# Patient Record
Sex: Female | Born: 1985 | Race: White | Hispanic: No | Marital: Single | Smoking: Never smoker
Health system: Southern US, Community
[De-identification: ages and names within clinical notes are randomized; demographics above are authoritative.]

## PROBLEM LIST (undated history)

## (undated) ENCOUNTER — Inpatient Hospital Stay (HOSPITAL_COMMUNITY): Payer: Self-pay

## (undated) DIAGNOSIS — E039 Hypothyroidism, unspecified: Secondary | ICD-10-CM

## (undated) DIAGNOSIS — F419 Anxiety disorder, unspecified: Secondary | ICD-10-CM

## (undated) DIAGNOSIS — K219 Gastro-esophageal reflux disease without esophagitis: Secondary | ICD-10-CM

## (undated) HISTORY — DX: Anxiety disorder, unspecified: F41.9

## (undated) HISTORY — DX: Gastro-esophageal reflux disease without esophagitis: K21.9

## (undated) HISTORY — PX: WISDOM TOOTH EXTRACTION: SHX21

## (undated) HISTORY — DX: Hypothyroidism, unspecified: E03.9

---

## 2009-08-07 ENCOUNTER — Encounter: Admission: RE | Admit: 2009-08-07 | Discharge: 2009-08-07 | Payer: Self-pay | Admitting: Family Medicine

## 2010-08-07 ENCOUNTER — Encounter: Admission: RE | Admit: 2010-08-07 | Discharge: 2010-08-07 | Payer: Self-pay | Admitting: General Surgery

## 2010-08-20 ENCOUNTER — Ambulatory Visit (HOSPITAL_COMMUNITY): Admission: RE | Admit: 2010-08-20 | Discharge: 2010-08-20 | Payer: Self-pay | Admitting: General Surgery

## 2011-07-22 ENCOUNTER — Other Ambulatory Visit: Payer: Self-pay | Admitting: Gastroenterology

## 2011-07-26 ENCOUNTER — Ambulatory Visit
Admission: RE | Admit: 2011-07-26 | Discharge: 2011-07-26 | Disposition: A | Discharge status: Home / Self Care | Payer: BLUE CROSS/BLUE SHIELD | Source: Ambulatory Visit | Attending: Gastroenterology | Admitting: Gastroenterology

## 2011-10-15 ENCOUNTER — Ambulatory Visit (INDEPENDENT_AMBULATORY_CARE_PROVIDER_SITE_OTHER): Payer: BLUE CROSS/BLUE SHIELD

## 2011-10-15 DIAGNOSIS — J111 Influenza due to unidentified influenza virus with other respiratory manifestations: Secondary | ICD-10-CM

## 2011-10-15 DIAGNOSIS — K130 Diseases of lips: Secondary | ICD-10-CM

## 2011-11-07 ENCOUNTER — Ambulatory Visit (INDEPENDENT_AMBULATORY_CARE_PROVIDER_SITE_OTHER): Payer: BC Managed Care – PPO

## 2011-11-07 DIAGNOSIS — B9789 Other viral agents as the cause of diseases classified elsewhere: Secondary | ICD-10-CM

## 2011-11-07 DIAGNOSIS — J029 Acute pharyngitis, unspecified: Secondary | ICD-10-CM

## 2011-11-07 DIAGNOSIS — R197 Diarrhea, unspecified: Secondary | ICD-10-CM

## 2011-11-08 ENCOUNTER — Ambulatory Visit (INDEPENDENT_AMBULATORY_CARE_PROVIDER_SITE_OTHER): Payer: BC Managed Care – PPO

## 2011-11-08 DIAGNOSIS — R05 Cough: Secondary | ICD-10-CM

## 2011-11-08 DIAGNOSIS — J029 Acute pharyngitis, unspecified: Secondary | ICD-10-CM

## 2011-11-11 ENCOUNTER — Ambulatory Visit (INDEPENDENT_AMBULATORY_CARE_PROVIDER_SITE_OTHER): Payer: BC Managed Care – PPO

## 2011-11-11 DIAGNOSIS — H66019 Acute suppurative otitis media with spontaneous rupture of ear drum, unspecified ear: Secondary | ICD-10-CM

## 2011-11-11 DIAGNOSIS — J40 Bronchitis, not specified as acute or chronic: Secondary | ICD-10-CM

## 2012-03-01 IMAGING — US US ABDOMEN COMPLETE
1 series · 14 of 25 positions shown · non-contrast
Comparison: None.

CLINICAL DATA: Right upper quadrant pain, nausea

ABDOMINAL ULTRASOUND COMPLETE

[Series 1: us abdomen complete · 0.29mm/px · 14 of 89 slices shown]
[im 1/89]
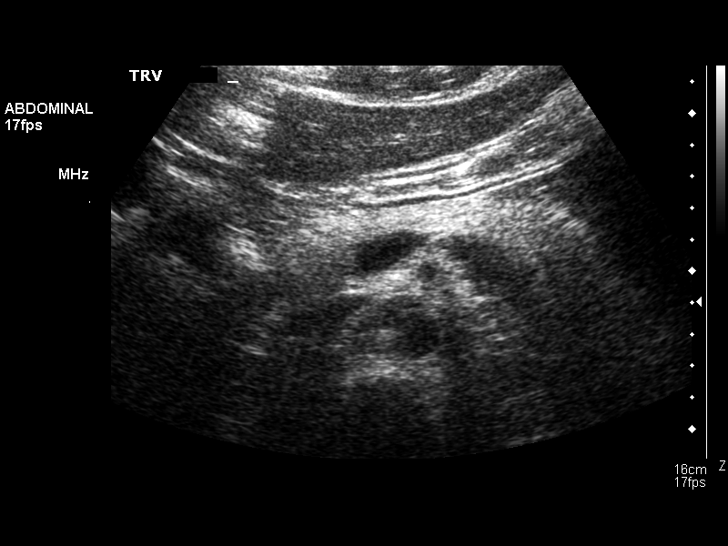
[im 8/89]
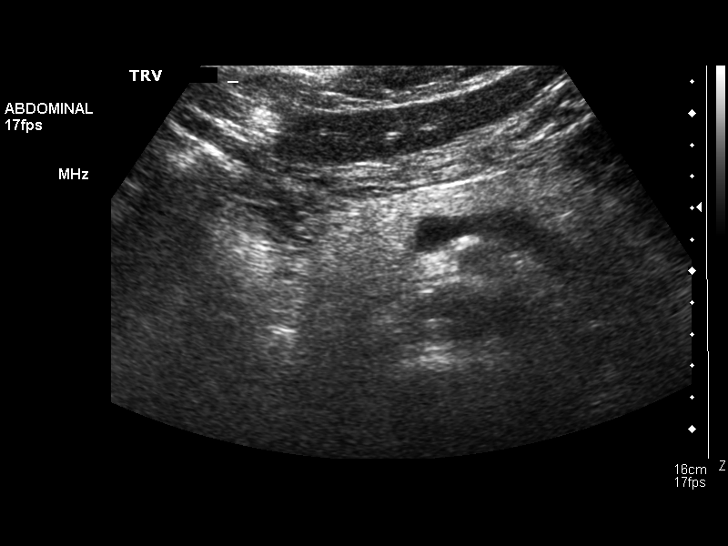
[im 15/89]
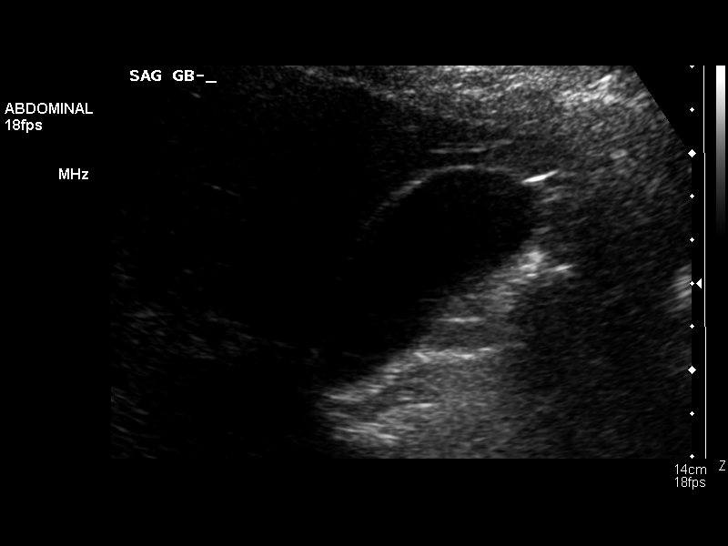
[im 23/89]
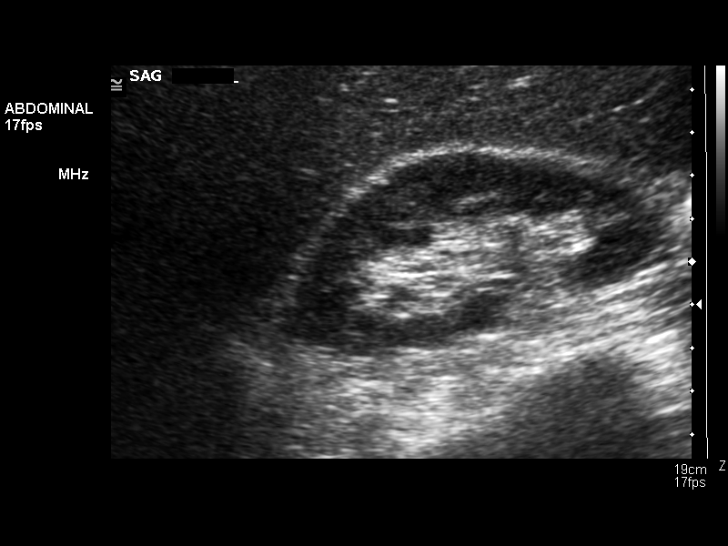
[im 30/89]
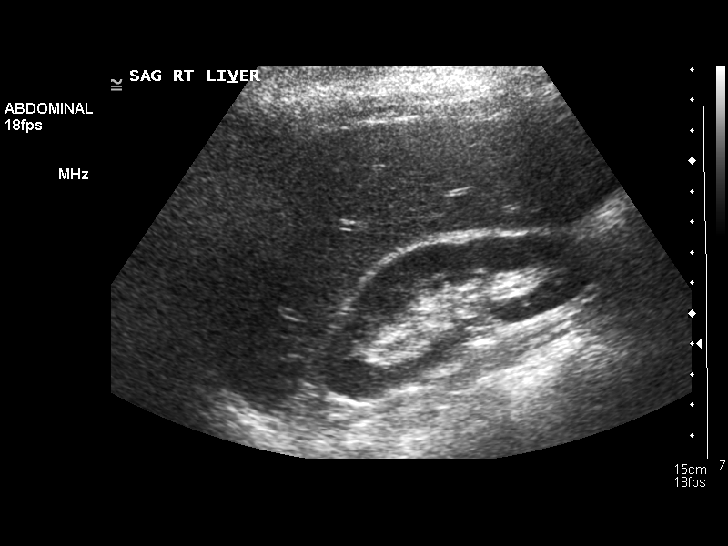
[im 34/89]
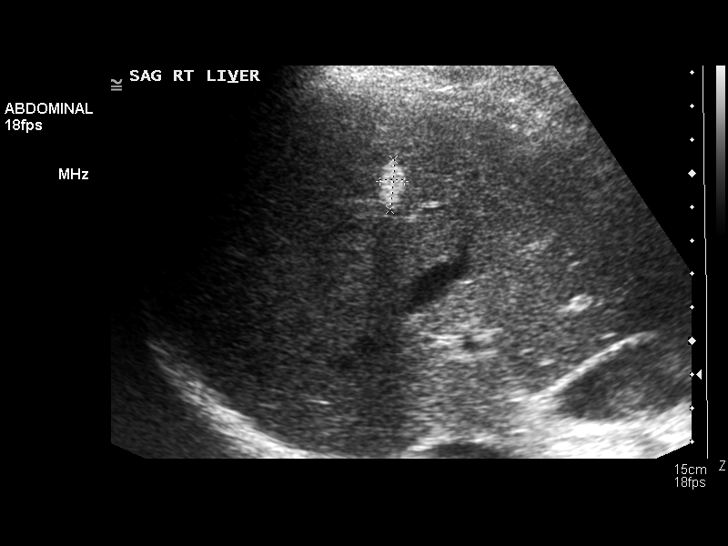
[im 41/89]
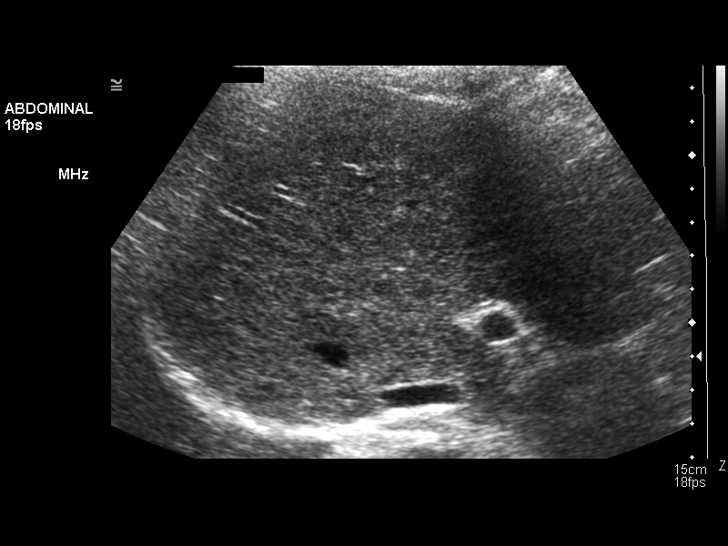
[im 48/89]
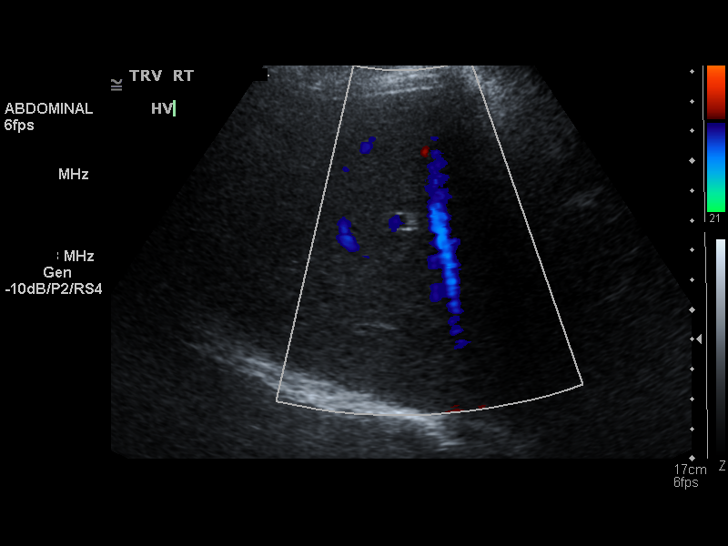
[im 56/89]
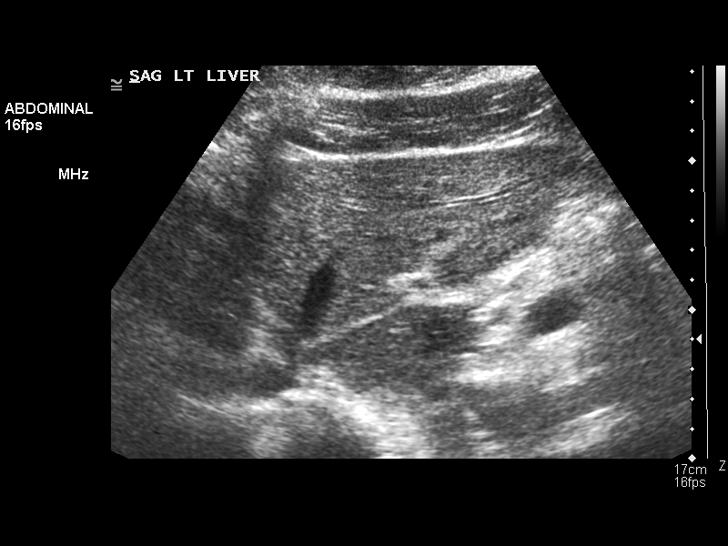
[im 59/89]
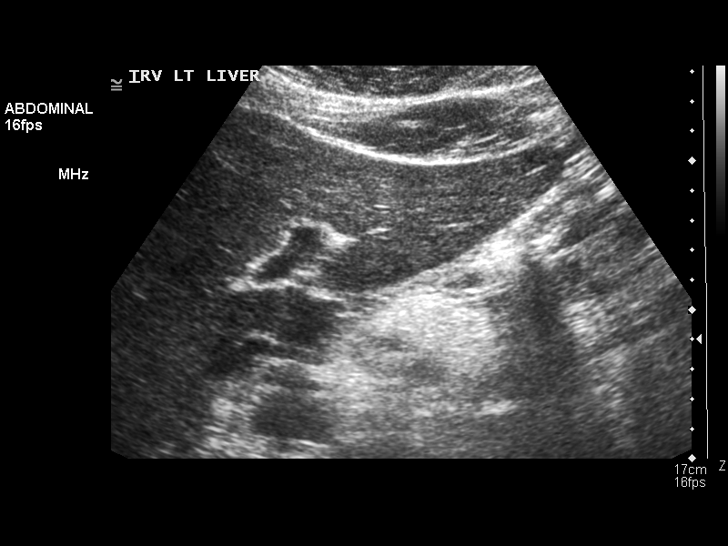
[im 67/89]
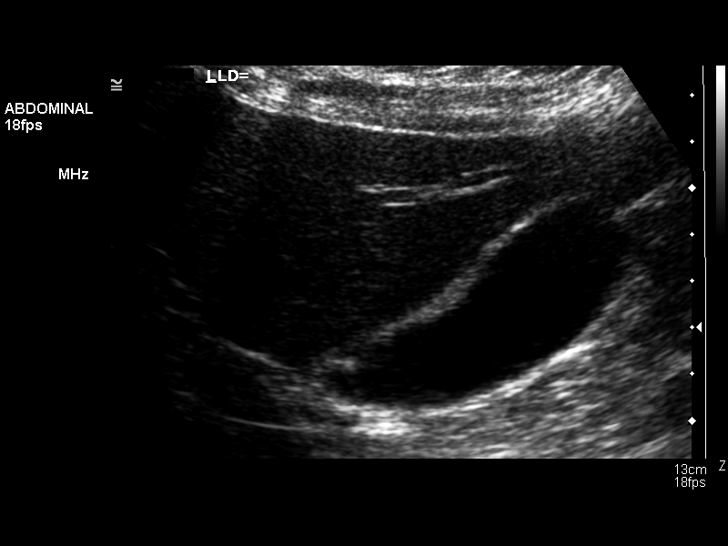
[im 74/89]
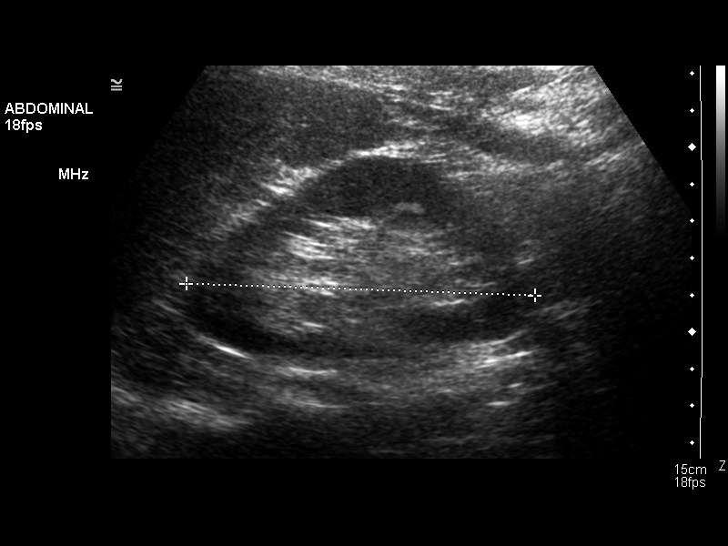
[im 81/89]
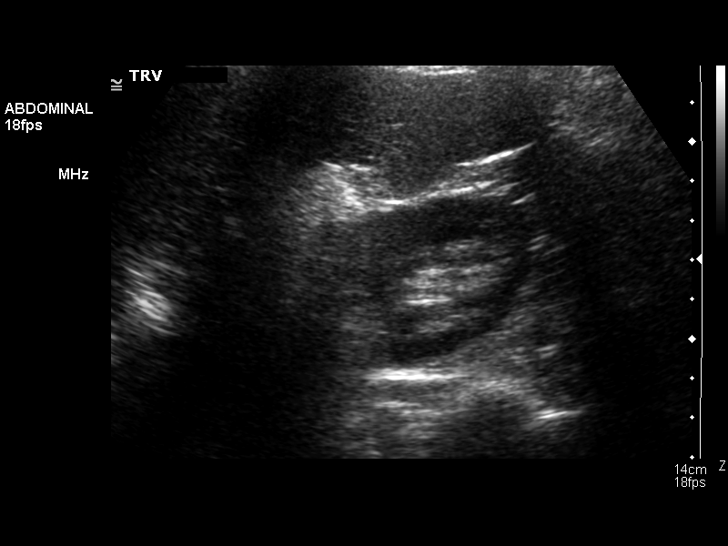
[im 89/89]
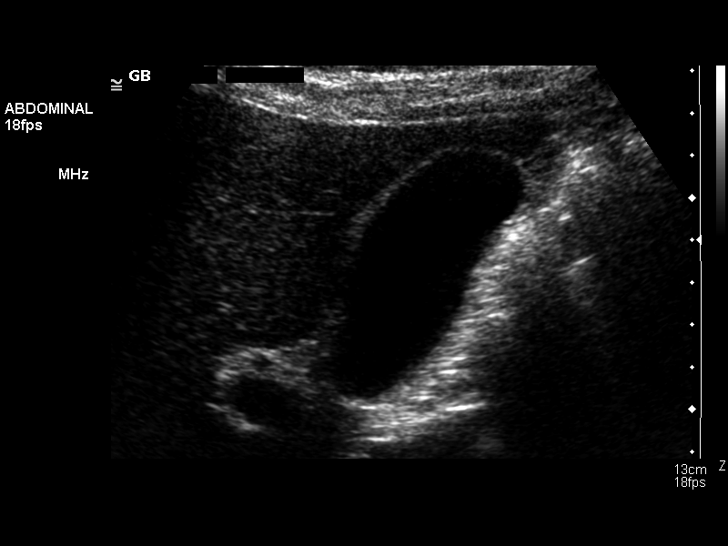

[14 of 25 positions shown; findings below may reference images not displayed]

FINDINGS: Gallbladder:  No gallstones, gallbladder wall thickening, or
pericholecystic fluid.

Common Bile Duct:  Within normal limits in caliber.

Liver: 1.5 cm well-circumscribed oval echogenic lesion within the
right hepatic lobe suspicious for a small hemangioma.  No other
hepatic lesion.  No intrahepatic biliary dilatation.

IVC:  Appears normal.

Pancreas:  No abnormality identified.

Spleen:  Within normal limits in size and echotexture.

Right kidney:  Normal in size and parenchymal echogenicity.  No
evidence of mass or hydronephrosis.

Left kidney:  Normal in size and parenchymal echogenicity.  No
evidence of mass or hydronephrosis.

Abdominal Aorta:  No aneurysm identified.
IMPRESSION: No acute finding by abdominal ultrasound.  Normal gallbladder.
Negative for gallstones.

1.5 cm right hepatic echogenic lesion, most consistent with a small
hemangioma.  Recommend follow-up 6 months to document stability.

## 2012-06-24 ENCOUNTER — Telehealth: Payer: Self-pay

## 2012-06-24 NOTE — Telephone Encounter (Signed)
Pt requests copy of pe and tb test from (she thinks) November of last year. States when called for pick up she might authorize someone else to pick it up.  Best: [phone]  bf

## 2012-06-25 NOTE — Telephone Encounter (Signed)
TB and PE copied for pick up. Left voicemail to notify patient.

## 2012-07-02 ENCOUNTER — Ambulatory Visit (INDEPENDENT_AMBULATORY_CARE_PROVIDER_SITE_OTHER): Payer: BC Managed Care – PPO | Admitting: Physician Assistant

## 2012-07-02 VITALS — BP 116/78 | HR 124 | Temp 98.4°F | Resp 16 | Ht 64.0 in | Wt 291.6 lb

## 2012-07-02 DIAGNOSIS — J029 Acute pharyngitis, unspecified: Secondary | ICD-10-CM

## 2012-07-02 DIAGNOSIS — R000 Tachycardia, unspecified: Secondary | ICD-10-CM

## 2012-07-02 DIAGNOSIS — E039 Hypothyroidism, unspecified: Secondary | ICD-10-CM | POA: Insufficient documentation

## 2012-07-02 LAB — POCT RAPID STREP A (OFFICE): Rapid Strep A Screen: NEGATIVE

## 2012-07-02 MED ORDER — FLUTICASONE PROPIONATE 50 MCG/ACT NA SUSP: 2.0000 | Freq: Every day | NASAL | Status: DC

## 2012-07-02 MED ORDER — IBUPROFEN 800 MG PO TABS: 800.0000 mg | ORAL_TABLET | Freq: Three times a day (TID) | ORAL | Status: AC | PRN

## 2012-07-02 NOTE — Progress Notes (Signed)
  Subjective:    Patient ID: Caroline Howe, female    DOB: 11-26-1985, 26 y.o.   MRN: [id number]  HPI 26 yr old CF presents with ST, nasal congestion, post nasal drip.  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 She denies f/c.  No known exposure to strep.  Review of Systems  All other systems reviewed and are negative.       Objective:   Physical Exam  Nursing note and vitals reviewed. Constitutional: She is oriented to person, place, and time. She appears well-developed and well-nourished.  HENT:  Head: Normocephalic and atraumatic.  Mouth/Throat: Oropharynx is clear and moist. No oropharyngeal exudate (erythema of posterior pharynx ).       R TM bulging slightly and erythematous.  L TM WNL.  +turbinates enlarged and inflamed.  Cardiovascular: Regular rhythm and normal heart sounds.  Exam reveals no gallop and no friction rub.   No murmur heard.      Increased rate  Pulmonary/Chest: Effort normal and breath sounds normal.  Neurological: She is alert and oriented to person, place, and time.  Skin: Skin is warm and dry.   Results for orders placed in visit on 07/02/12  POCT RAPID STREP A (OFFICE)      Component Value Range   Rapid Strep A Screen Negative  Negative      Assessment & Plan:  URI with pharyngitis-likely all viral.  Fluids, rest, and respiratory care. flonase and ibuprofen. Asymptomatic Tachycardia-unsure if illness related.  Patient instructed to keep a watch on this the next couple of weeks and if it persists, she will see her PCP and have her thyroid levels checked/tachycardia workup. Can send in amoxicillin 500mg  2 bid X 10 days if not improving in 3-5 days.

## 2012-07-07 ENCOUNTER — Other Ambulatory Visit: Payer: Self-pay | Admitting: Physician Assistant

## 2012-07-07 MED ORDER — BENZONATATE 100 MG PO CAPS
100.0000 mg | ORAL_CAPSULE | Freq: Three times a day (TID) | ORAL | Status: AC | PRN
Start: 2012-07-07 — End: 2012-07-14

## 2012-07-07 MED ORDER — AMOXICILLIN 875 MG PO TABS: 875.0000 mg | ORAL_TABLET | Freq: Two times a day (BID) | ORAL | Status: AC

## 2012-07-07 MED ORDER — HYDROCOD POLST-CHLORPHEN POLST 10-8 MG/5ML PO LQCR: 5.0000 mL | Freq: Two times a day (BID) | ORAL | Status: DC | PRN

## 2012-07-07 NOTE — Progress Notes (Signed)
Patient requested amoxicilin and a cough medication. Per Angela's note she was to let us know know if she wasn't improving and we could rx an antibiotic.

## 2012-07-25 ENCOUNTER — Ambulatory Visit (INDEPENDENT_AMBULATORY_CARE_PROVIDER_SITE_OTHER): Payer: BC Managed Care – PPO | Admitting: Family Medicine

## 2012-07-25 VITALS — BP 139/95 | HR 92 | Temp 98.0°F | Resp 18 | Ht 64.5 in | Wt 290.8 lb

## 2012-07-25 DIAGNOSIS — N760 Acute vaginitis: Secondary | ICD-10-CM

## 2012-07-25 DIAGNOSIS — N898 Other specified noninflammatory disorders of vagina: Secondary | ICD-10-CM

## 2012-07-25 DIAGNOSIS — L293 Anogenital pruritus, unspecified: Secondary | ICD-10-CM

## 2012-07-25 LAB — POCT WET PREP WITH KOH
KOH Prep POC: NEGATIVE
Trichomonas, UA: NEGATIVE
Yeast Wet Prep HPF POC: NEGATIVE

## 2012-07-25 LAB — GLUCOSE, POCT (MANUAL RESULT ENTRY): POC Glucose: 102 mg/dl — AB (ref 70–99)

## 2012-07-25 MED ORDER — FLUCONAZOLE 150 MG PO TABS: 150.0000 mg | ORAL_TABLET | Freq: Once | ORAL | Status: DC

## 2012-07-25 MED ORDER — METRONIDAZOLE 500 MG PO TABS: ORAL_TABLET | ORAL | Status: DC

## 2012-07-25 NOTE — Progress Notes (Signed)
Subjective: Patient completed a course of antibiotics about 5-7 days ago. For the past couple days she's had vaginal symptoms with itching burning irritation. She used a vaginal suppository last night. She has a history of many episodes of peritonitis in the distant past. She is not known to be diabetic.  Objective: Morbidly obese white female accompanied by her long-term boyfriend. Normal external genitalia. Vaginal and is full of thick cream from a suppository which she used last night. Bimanual exam unremarkable wet prep was taken and swelling of the cream I could.  Assessment: Vaginitis probably from recent antibiotics Morbid obesity Rule out diabetes   Plan  Check glucose and wet prep  Results for orders placed in visit on 07/25/12  POCT WET PREP WITH KOH      Component Value Range   Trichomonas, UA Negative     Clue Cells Wet Prep HPF POC 2-3     Epithelial Wet Prep HPF POC 3-10     Yeast Wet Prep HPF POC NEG     Bacteria Wet Prep HPF POC 2+ COCCI     RBC Wet Prep HPF POC 0-1     WBC Wet Prep HPF POC 2-3     KOH Prep POC Negative    GLUCOSE, POCT (MANUAL RESULT ENTRY)      Component Value Range   POC Glucose 102 (*) 70 - 99 mg/dl   Despite no dx clearly will rx with diflucan and flagyl.  Return when no cream is in vagina if sx persist.

## 2012-07-25 NOTE — Patient Instructions (Addendum)
Use the flagyl for 1 week. No alcohol Use single dose of difulcan

## 2012-08-12 ENCOUNTER — Ambulatory Visit (INDEPENDENT_AMBULATORY_CARE_PROVIDER_SITE_OTHER): Payer: BC Managed Care – PPO | Admitting: Family Medicine

## 2012-08-12 VITALS — BP 126/99 | HR 101 | Temp 97.9°F | Resp 22 | Ht 64.0 in | Wt 289.8 lb

## 2012-08-12 DIAGNOSIS — K146 Glossodynia: Secondary | ICD-10-CM

## 2012-08-12 MED ORDER — DIPHENHYD-HYDROCORT-NYSTATIN MT SUSP: 5.0000 mL | Freq: Three times a day (TID) | OROMUCOSAL | Status: DC

## 2012-08-12 NOTE — Progress Notes (Signed)
Subjective: Patient is a 26 year old female coming in with a complaint of a yellow tongue. Patient states that she's had this approximately for 3 days. Patient states that then she attempt to pressure, which did get yellow off but caused bleeding. Patient is concerned that she has a thrush infection. Patient denies any recent medication changes or any recent antibiotic use. Patient also denies any sick contacts herself had a recent viral illness. Denies any fevers or chills denies any abnormal weight loss.  Review of systems as above  Past medical surgical family and social history reviewed without any changes  Physical exam Blood pressure 126/99, pulse 101, temperature 97.9 F (36.6 C), temperature source Oral, resp. rate 22, height 5\' 4"  (1.626 m), weight 289 lb 12.8 oz (131.452 kg), last menstrual period 06/28/2012, SpO2 100.00%. General: No apparent distress alert and oriented x3 severely obese female HEENT: Pupils equal round reactive to light and accommodation conjunctiva normal, moist mucous membranes with mild phlegm accumulation on tongue. No true signs of infection. No signs of bleeding. Posterior pharynx does have a postnasal drip. Pulmonary: Clear to auscultation bilaterally Cardiovascular: Regular in rhythm no murmur Abdomen: Bowel sounds positive nontender nondistended  Assessment and plan One tongue bleeding that has resolved at this time. I likely think this is secondary to her postnasal drip and aggressive time cleaning. Patient though is very adamant that she feels that she may have an infection. Told her I would send her in some nystatin Zosyn follow if this would make her feel better the likelihood of this medication causing any side effects is minimal. Patient is much appreciated. Patient return in 10-14 days if no improvement.

## 2012-12-11 ENCOUNTER — Ambulatory Visit (INDEPENDENT_AMBULATORY_CARE_PROVIDER_SITE_OTHER): Payer: BC Managed Care – PPO | Admitting: Emergency Medicine

## 2012-12-11 VITALS — BP 136/91 | HR 101 | Temp 99.0°F | Resp 18 | Ht 64.2 in | Wt 275.8 lb

## 2012-12-11 DIAGNOSIS — B852 Pediculosis, unspecified: Secondary | ICD-10-CM

## 2012-12-11 MED ORDER — PERMETHRIN 0.5 % AERO: INHALATION_SPRAY | Freq: Once | Status: DC

## 2012-12-11 MED ORDER — SULFAMETHOXAZOLE-TRIMETHOPRIM 800-160 MG PO TABS: 1.0000 | ORAL_TABLET | Freq: Two times a day (BID) | ORAL | Status: DC

## 2012-12-11 NOTE — Patient Instructions (Addendum)
Pyrethrins; Piperonyl Butoxide Shampoo What is this medicine? PYRETHINS; PIPERONYL BUTOXIDE (pi RETH rins; PI per on il byoo TOX ide) is used to treat lice. It acts by destroying the lice, but it does not destroy their eggs (nits). This medicine may be used to treat head lice, body lice, or pubic lice. This medicine may be used for other purposes; ask your health care provider or pharmacist if you have questions. What should I tell my health care provider before I take this medicine? They need to know if you have any of these conditions: -asthma -an unusual or allergic reaction to pyrethrins, piperonyl butoxide, other medicines, foods, dyes, ragweed, or preservatives -pregnant or trying to get pregnant -breast-feeding How should I use this medicine? This medicine is for external use only. Do not take by mouth. Follow the directions on the package label. Use this medicine in a well ventilated area. Shake well before use. Apply to dry hair. Keep this medicine away from your eyes, mouth, nose, and vaginal opening. Apply to affected area until all the hair is thoroughly wet with product. Keep this medicine on your hair or affected area for 10 minutes. After 10 minutes, add warm water then rub into a lather. Rinse and dry with a clean towel. Use a nit comb to remove any of the remaining lice or nits. A second treatment must be done in 7 to 10 days to kill any newly hatched lice. Talk to your pediatrician regarding the use of this medicine in children. While this drug may be prescribed for children as young as 21 years old for selected conditions, precautions do apply. Overdosage: If you think you have taken too much of this medicine contact a poison control center or emergency room at once. NOTE: This medicine is only for you. Do not share this medicine with others. What if I miss a dose? If you miss a dose, use it as soon as you can. If it is almost time for your next dose, use only that dose. Do not use  double or extra doses. What may interact with this medicine? Interactions are not expected. This list may not describe all possible interactions. Give your health care provider a list of all the medicines, herbs, non-prescription drugs, or dietary supplements you use. Also tell them if you smoke, drink alcohol, or use illegal drugs. Some items may interact with your medicine. What should I watch for while using this medicine? Lice infections can cause itching and irritation. This medicine may cause more itching for a short time after use. This does not mean that the medicine did not work. Lice can be spread from one person to another by direct contact with clothing, hats, scarves, bedding, towels, washcloths, hairbrushes, and combs. All members of the house should be checked for lice. Treat everyone who is infected. For cases of pubic lice, sexual partners should be treated at the same time to prevent reinfection. To prevent reinfection or spreading of the infection, the following steps should be taken: Machine wash all clothing, bedding, towels, and washcloths in very hot water and dry them using the hot cycle of a dryer for at least 20 minutes. Clothing or bedding that cannot be washed should be dry cleaned or sealed in an airtight plastic bag for 4 weeks. Shampoo any wigs or hairpieces. You should also wash all hairbrushes and combs in very hot soapy water (above 130 F) for 5 to 10 minutes. Do not share your hairbrushes or combs with other people.  Wash all toys in very hot water (above 130 F) for 5 to 10 minutes or seal in an airtight plastic bag for 4 weeks. Also, clean the house or room by vacuuming furniture, rugs, and floors. What side effects may I notice from receiving this medicine? Side effects that you should report to your doctor or health care professional as soon as possible: -allergic reactions like skin rash, itching or hives, swelling of the face, lips, or tongue -breathing  problems -skin burning, irritation Side effects that usually do not require medical attention (report to your doctor or health care professional if they continue or are bothersome): -dry, itchy, red skin -tingling sensation This list may not describe all possible side effects. Call your doctor for medical advice about side effects. You may report side effects to FDA at 1-800-FDA-1088. Where should I keep my medicine? Keep out of the reach of children. Store at room temperature between 15 and 30 degrees C (59 and 86 degrees F). Throw away any unused medicine after the expiration date. NOTE: This sheet is a summary. It may not cover all possible information. If you have questions about this medicine, talk to your doctor, pharmacist, or health care provider.  2013, [employer] Standard. (05/20/2008 2:07:53 PM) Head and Pubic Lice Lice are tiny, light brown insects with claws on the ends of their legs. They are small parasites that live on the human body. Lice often make their home in your hair. They hatch from little round eggs (nits), which are attached to the base of hairs. They spread by:  Direct contact with an infested person.  Infested personal items such as combs, brushes, towels, clothing, pillow cases and sheets. The parasite that causes your condition may also live in clothes which have been worn within the week before treatment. Therefore, it is necessary to wash your clothes, bed linens, towels, combs and brushes. Any woolens can be put in an air-tight plastic bag for one week. You need to use fresh clothes, towels and sheets after your treatment is completed. Re-treatment is usually not necessary if instructions are followed. If necessary, treatment may be repeated in 7 days. The entire family may require treatment. Sexual partners should be treated if the nits are present in the pubic area. TREATMENT  Apply enough medicated shampoo or cream to wet hair and skin in and around the  infected areas.  Work thoroughly into hair and leave in according to instructions.  Add a small amount of water until a good lather forms.  Rinse thoroughly.  Towel briskly.  When hair is dry, any remaining nits, cream or shampoo may be removed with a fine-tooth comb or tweezers. The nits resemble dandruff; however they are glued to the hair follicle and are difficult to brush out. Frequent fine combing and shampoos are necessary. A towel soaked in white vinegar and left on the hair for 2 hours will also help soften the glue which holds the nits on the hair. Medicated shampoo or cream should not be used on children or pregnant women without a caregiver's prescription or instructions. SEEK MEDICAL CARE IF:   You or your child develops sores that look infected.  The rash does not go away in one week.  The lice or nits return or persist in spite of treatment. Document Released: 10/14/2005 Document Revised: 01/06/2012 Document Reviewed: 05/13/2007 [facility] Patient Information 2013 [place], [place].

## 2012-12-11 NOTE — Progress Notes (Signed)
[facility] [address] [place] [address] [phone]  Date:  12/11/2012   Name:  Caroline Howe   DOB:  September 02, 1986   MRN:  [id number]  PCP:  Tally Due, MD    Chief Complaint: Head Lice   History of Present Illness:  Yajaira Doffing is a 27 y.o. very pleasant female patient who presents with the following:  Found lice on her hair today.  Moderate itching.  Teaches school.    Patient Active Problem List  Diagnosis  . Unspecified hypothyroidism    Past Medical History  Diagnosis Date  . Anxiety   . GERD (gastroesophageal reflux disease)   . Hypothyroidism     History reviewed. No pertinent past surgical history.  History  Substance Use Topics  . Smoking status: Never Smoker   . Smokeless tobacco: Never Used  . Alcohol Use: Yes     Comment: occasional    Family History  Problem Relation Age of Onset  . Diabetes Mother   . Hypertension Father   . Liver cancer Maternal Grandfather   . Colon cancer Paternal Grandfather     No Known Allergies  Medication list has been reviewed and updated.  Current Outpatient Prescriptions on File Prior to Visit  Medication Sig Dispense Refill  . esomeprazole (NEXIUM) 40 MG capsule Take 40 mg by mouth daily before breakfast.      . levothyroxine (SYNTHROID, LEVOTHROID) 75 MCG tablet Take 75 mcg by mouth daily.      . chlorpheniramine-HYDROcodone (TUSSIONEX PENNKINETIC ER) 10-8 MG/5ML LQCR Take 5 mLs by mouth every 12 (twelve) hours as needed (cough).  60 mL  0  . Diphenhyd-Hydrocort-Nystatin SUSP Use as directed 5 mLs in the mouth or throat 3 (three) times daily.  100 mL  0  . fluconazole (DIFLUCAN) 150 MG tablet Take 1 tablet (150 mg total) by mouth once.  1 tablet  0  . fluticasone (FLONASE) 50 MCG/ACT nasal spray Place 2 sprays into the nose daily.  16 g  6  . metroNIDAZOLE (FLAGYL) 500 MG tablet Take one twice daily for bv  14 tablet  0   No current facility-administered medications on file  prior to visit.    Review of Systems:  As per HPI, otherwise negative.    Physical Examination: Filed Vitals:   12/11/12 1601  BP: 136/91  Pulse: 101  Temp: 99 F (37.2 C)  Resp: 18   Filed Vitals:   12/11/12 1601  Height: 5' 4.2" (1.631 m)  Weight: 275 lb 12.8 oz (125.102 kg)   Body mass index is 47.03 kg/(m^2). Ideal Body Weight: Weight in (lb) to have BMI = 25: 146.3   GEN: WDWN, NAD, Non-toxic, Alert & Oriented x 3 HEENT: Atraumatic, Normocephalic.  Ears and Nose: No external deformity. EXTR: No clubbing/cyanosis/edema NEURO: Normal gait.  PSYCH: Normally interactive. Conversant. Not depressed or anxious appearing.  Calm demeanor.  SCALP:  Nits on hair shafts   Assessment and Plan: Lice RID Septra ds (per sanford)  Carmelina Dane, MD

## 2013-03-01 ENCOUNTER — Ambulatory Visit (INDEPENDENT_AMBULATORY_CARE_PROVIDER_SITE_OTHER): Payer: BC Managed Care – PPO | Admitting: Family Medicine

## 2013-03-01 VITALS — BP 108/66 | HR 111 | Temp 98.7°F | Resp 16 | Ht 64.0 in | Wt 289.0 lb

## 2013-03-01 DIAGNOSIS — H6593 Unspecified nonsuppurative otitis media, bilateral: Secondary | ICD-10-CM

## 2013-03-01 DIAGNOSIS — H6590 Unspecified nonsuppurative otitis media, unspecified ear: Secondary | ICD-10-CM

## 2013-03-01 MED ORDER — MOMETASONE FUROATE 50 MCG/ACT NA SUSP: 2.0000 | Freq: Every day | NASAL | Status: DC

## 2013-03-01 NOTE — Patient Instructions (Addendum)
Serous Otitis Media    Serous otitis media is also known as otitis media with effusion (OME). It means there is fluid in the middle ear space. This space contains the bones for hearing and air. Air in the middle ear space helps to transmit sound.    The air gets there through the eustachian tube. This tube goes from the back of the throat to the middle ear space. It keeps the pressure in the middle ear the same as the outside world. It also helps to drain fluid from the middle ear space.  CAUSES    OME occurs when the eustachian tube gets blocked. Blockage can come from:   Ear infections.   Colds and other upper respiratory infections.   Allergies.   Irritants such as cigarette smoke.   Sudden changes in air pressure (such as descending in an airplane).   Enlarged adenoids.  During colds and upper respiratory infections, the middle ear space can become temporarily filled with fluid. This can happen after an ear infection also. Once the infection clears, the fluid will generally drain out of the ear through the eustachian tube. If it does not, then OME occurs.  SYMPTOMS     Hearing loss.   A feeling of fullness in the ear  but no pain.   Young children may not show any symptoms.  DIAGNOSIS     Diagnosis of OME is made by an ear exam.   Tests may be done to check on the movement of the eardrum.   Hearing exams may be done.  TREATMENT     The fluid most often goes away without treatment.   If allergy is the cause, allergy treatment may be helpful.   Fluid that persists for several months may require minor surgery. A small tube is placed in the ear drum to:   Drain the fluid.   Restore the air in the middle ear space.   In certain situations, antibiotics are used to avoid surgery.   Surgery may be done to remove enlarged adenoids (if this is the cause).  HOME CARE INSTRUCTIONS     Keep children away from tobacco smoke.   Be sure to keep follow up appointments, if any.  SEEK MEDICAL CARE IF:      Hearing is not better in 3 months.   Hearing is worse.   Ear pain.   Drainage from the ear.   Dizziness.  Document Released: 01/04/2004 Document Revised: 01/06/2012 Document Reviewed: 11/03/2008  [employer] Patient Information 2013 [employer].

## 2013-03-01 NOTE — Progress Notes (Signed)
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  Some facial pressure as well.  No fever, loss of hearing, vertigo  Obj:  NAD Tm's opaque, no erythema, loss of light reflex but malleoli clear Oroph:  Clear Nose:  M/p discharge Neck:  Supple, no adenopathy  Assessment:  SOM  Plan:  nasonex  Signed, Elvina Sidle, MD

## 2013-03-14 ENCOUNTER — Ambulatory Visit (INDEPENDENT_AMBULATORY_CARE_PROVIDER_SITE_OTHER): Payer: BC Managed Care – PPO | Admitting: Physician Assistant

## 2013-03-14 VITALS — BP 143/75 | HR 100 | Temp 98.8°F | Resp 18 | Ht 65.0 in | Wt 283.0 lb

## 2013-03-14 DIAGNOSIS — R059 Cough, unspecified: Secondary | ICD-10-CM

## 2013-03-14 DIAGNOSIS — R05 Cough: Secondary | ICD-10-CM

## 2013-03-14 DIAGNOSIS — J40 Bronchitis, not specified as acute or chronic: Secondary | ICD-10-CM

## 2013-03-14 MED ORDER — AZITHROMYCIN 250 MG PO TABS: ORAL_TABLET | ORAL | Status: DC

## 2013-03-14 MED ORDER — BENZONATATE 100 MG PO CAPS: 100.0000 mg | ORAL_CAPSULE | Freq: Three times a day (TID) | ORAL | Status: DC | PRN

## 2013-03-14 NOTE — Progress Notes (Signed)
Patient ID: Caroline Howe MRN: [id number], DOB: [birth year], 27 y.o. Date of Encounter: 03/14/2013, 11:50 AM  Primary Physician: Tally Due, MD  Chief Complaint:  Chief Complaint  Patient presents with  . Follow-up    cough    HPI: 27 y.o. female presents with a 15 day history of nasal congestion, post nasal drip, and cough. Mild sinus pressure. Afebrile. No chills. Nasal congestion thick and green/yellow. Cough is productive of green/yellow sputum and not associated with time of day. No SOB or wheezing. Was seen on 03/01/13 and prescribed Nasonex which has helped considerably with her ears, however now the drainage has increased down her throat causing an increase in her cough. No GI complaints. Appetite normal. Works as a Engineer, site. No recent antibiotics, or recent travels.   No leg trauma, sedentary periods, h/o cancer, or tobacco use.  Past Medical History  Diagnosis Date  . Anxiety   . GERD (gastroesophageal reflux disease)   . Hypothyroidism      Home Meds: Prior to Admission medications   Medication Sig Start Date End Date Taking? Authorizing Provider  esomeprazole (NEXIUM) 40 MG capsule Take 40 mg by mouth daily before breakfast.   Yes Historical Provider, MD  levothyroxine (SYNTHROID, LEVOTHROID) 75 MCG tablet Take 75 mcg by mouth daily.   Yes Historical Provider, MD  mometasone (NASONEX) 50 MCG/ACT nasal spray Place 2 sprays into the nose daily. 03/01/13  Yes Elvina Sidle, MD  azithromycin (ZITHROMAX Z-PAK) 250 MG tablet 2 tabs po first day, then 1 tab po next 4 days 03/14/13   Raymon Mutton Shaila Gilchrest, PA-C  benzonatate (TESSALON PERLES) 100 MG capsule Take 1 capsule (100 mg total) by mouth 3 (three) times daily as needed for cough. 03/14/13   Raymon Mutton Shakevia [name], PA-C  fluticasone (FLONASE) 50 MCG/ACT nasal spray Place 2 sprays into the nose daily. 07/02/12 07/02/13  Anders Simmonds, PA-C  pyrethrins-piperonyl butoxide 0.5 % bottle Apply topically once. 12/11/12   Phillips Odor, MD     Allergies: No Known Allergies  History   Social History  . Marital Status: Single    Spouse Name: N/A    Number of Children: N/A  . Years of Education: N/A   Occupational History  . Not on file.   Social History Main Topics  . Smoking status: Never Smoker   . Smokeless tobacco: Never Used  . Alcohol Use: Yes     Comment: occasional  . Drug Use: No  . Sexually Active: Yes    Birth Control/ Protection: Condom   Other Topics Concern  . Not on file   Social History Narrative  . No narrative on file     Review of Systems: Constitutional: negative for chills, fever, night sweats or weight changes HEENT: see above Cardiovascular: negative for chest pain or palpitations Respiratory: positive for cough. negative for hemoptysis, wheezing, or shortness of breath Abdominal: negative for abdominal pain, nausea, vomiting or diarrhea Dermatological: negative for rash Neurologic: negative for headache   Physical Exam: Blood pressure 143/75, pulse 100, temperature 98.8 F (37.1 C), temperature source Oral, resp. rate 18, height 5\' 5"  (1.651 m), weight 283 lb (128.368 kg), last menstrual period 02/22/2013, SpO2 98.00%., Body mass index is 47.09 kg/(m^2). General: Well developed, well nourished, in no acute distress. Head: Normocephalic, atraumatic, eyes without discharge, sclera non-icteric, nares are congested. Bilateral auditory canals clear, TM's are without perforation, pearly grey with reflective cone of light bilaterally. No sinus TTP. Oral cavity moist, dentition normal. Posterior  pharynx with post nasal drip and mild erythema. No peritonsillar abscess or tonsillar exudate. Uvula midline.  Neck: Supple. No thyromegaly. Full ROM. No lymphadenopathy. Lungs: Coarse breath sounds bilaterally without wheezes, rales, or rhonchi. Breathing is unlabored.  Heart: RRR with S1 S2. No murmurs, rubs, or gallops appreciated. Msk:  Strength and tone normal for age. Extremities: No  clubbing or cyanosis. No edema. Neuro: Alert and oriented X 3. Moves all extremities spontaneously. CNII-XII grossly in tact. Psych:  Responds to questions appropriately with a normal affect.     ASSESSMENT AND PLAN:  27 y.o. female with bronchitis and cough -Azithromycin 250 MG #6 2 po first day then 1 po next 4 days no RF -Tessalon Perles 100 mg 1 po tid prn cough #40 no RF  -Mucinex -Continue Nasonex -Tylenol/Motrin prn -Rest/fluids -RTC precautions -RTC 3-5 days if no improvement  Signed, Eula Listen, PA-C 03/14/2013 11:50 AM

## 2013-04-14 ENCOUNTER — Ambulatory Visit (INDEPENDENT_AMBULATORY_CARE_PROVIDER_SITE_OTHER): Payer: BC Managed Care – PPO | Admitting: Family Medicine

## 2013-04-14 VITALS — BP 132/92 | HR 100 | Temp 98.0°F | Resp 16 | Ht 64.5 in | Wt 282.0 lb

## 2013-04-14 DIAGNOSIS — B9689 Other specified bacterial agents as the cause of diseases classified elsewhere: Secondary | ICD-10-CM

## 2013-04-14 DIAGNOSIS — N760 Acute vaginitis: Secondary | ICD-10-CM

## 2013-04-14 DIAGNOSIS — L293 Anogenital pruritus, unspecified: Secondary | ICD-10-CM

## 2013-04-14 DIAGNOSIS — N898 Other specified noninflammatory disorders of vagina: Secondary | ICD-10-CM

## 2013-04-14 DIAGNOSIS — B379 Candidiasis, unspecified: Secondary | ICD-10-CM

## 2013-04-14 LAB — POCT WET PREP WITH KOH
KOH Prep POC: POSITIVE
Trichomonas, UA: NEGATIVE
WBC Wet Prep HPF POC: NEGATIVE
Yeast Wet Prep HPF POC: NEGATIVE

## 2013-04-14 LAB — POCT URINALYSIS DIPSTICK
Bilirubin, UA: NEGATIVE
Blood, UA: NEGATIVE
Glucose, UA: NEGATIVE
Ketones, UA: NEGATIVE
Leukocytes, UA: NEGATIVE
Nitrite, UA: NEGATIVE
Protein, UA: NEGATIVE
Spec Grav, UA: 1.02
Urobilinogen, UA: 0.2
pH, UA: 7

## 2013-04-14 LAB — POCT UA - MICROSCOPIC ONLY
Casts, Ur, LPF, POC: NEGATIVE
Crystals, Ur, HPF, POC: NEGATIVE
Mucus, UA: POSITIVE
Yeast, UA: NEGATIVE

## 2013-04-14 MED ORDER — FLUCONAZOLE 150 MG PO TABS: 150.0000 mg | ORAL_TABLET | Freq: Once | ORAL | Status: DC

## 2013-04-14 MED ORDER — METRONIDAZOLE 500 MG PO TABS: 500.0000 mg | ORAL_TABLET | Freq: Two times a day (BID) | ORAL | Status: DC

## 2013-04-14 NOTE — Patient Instructions (Addendum)
Candida Infection, Adult A candida infection (also called yeast, fungus and Monilia infection) is an overgrowth of yeast that can occur anywhere on the body. A yeast infection commonly occurs in warm, moist body areas. Usually, the infection remains localized but can spread to become a systemic infection. A yeast infection may be a sign of a more severe disease such as diabetes, leukemia, or AIDS. A yeast infection can occur in both men and women. In women, Candida vaginitis is a vaginal infection. It is one of the most common causes of vaginitis. Men usually do not have symptoms or know they have an infection until other problems develop. Men may find out they have a yeast infection because their sex partner has a yeast infection. Uncircumcised men are more likely to get a yeast infection than circumcised men. This is because the uncircumcised glans is not exposed to air and does not remain as dry as that of a circumcised glans. Older adults may develop yeast infections around dentures. CAUSES  Women  Antibiotics.  Steroid medication taken for a long time.  Being overweight (obese).  Diabetes.  Poor immune condition.  Certain serious medical conditions.  Immune suppressive medications for organ transplant patients.  Chemotherapy.  Pregnancy.  Menstration.  Stress and fatigue.  Intravenous drug use.  Oral contraceptives.  Wearing tight-fitting clothes in the crotch area.  Catching it from a sex partner who has a yeast infection.  Spermicide.  Intravenous, urinary, or other catheters. Men  Catching it from a sex partner who has a yeast infection.  Having oral or anal sex with a person who has the infection.  Spermicide.  Diabetes.  Antibiotics.  Poor immune system.  Medications that suppress the immune system.  Intravenous drug use.  Intravenous, urinary, or other catheters. SYMPTOMS  Women  Thick, white vaginal discharge.  Vaginal itching.  Redness and  swelling in and around the vagina.  Irritation of the lips of the vagina and perineum.  Blisters on the vaginal lips and perineum.  Painful sexual intercourse.  Low blood sugar (hypoglycemia).  Painful urination.  Bladder infections.  Intestinal problems such as constipation, indigestion, bad breath, bloating, increase in gas, diarrhea, or loose stools. Men  Men may develop intestinal problems such as constipation, indigestion, bad breath, bloating, increase in gas, diarrhea, or loose stools.  Dry, cracked skin on the penis with itching or discomfort.  Jock itch.  Dry, flaky skin.  Athlete's foot.  Hypoglycemia. DIAGNOSIS  Women  A history and an exam are performed.  The discharge may be examined under a microscope.  A culture may be taken of the discharge. Men  A history and an exam are performed.  Any discharge from the penis or areas of cracked skin will be looked at under the microscope and cultured.  Stool samples may be cultured. TREATMENT  Women  Vaginal antifungal suppositories and creams.  Medicated creams to decrease irritation and itching on the outside of the vagina.  Warm compresses to the perineal area to decrease swelling and discomfort.  Oral antifungal medications.  Medicated vaginal suppositories or cream for repeated or recurrent infections.  Wash and dry the irritation areas before applying the cream.  Eating yogurt with lactobacillus may help with prevention and treatment.  Sometimes painting the vagina with gentian violet solution may help if creams and suppositories do not work. Men  Antifungal creams and oral antifungal medications.  Sometimes treatment must continue for 30 days after the symptoms go away to prevent recurrence. HOME CARE   INSTRUCTIONS  Women  Use cotton underwear and avoid tight-fitting clothing.  Avoid colored, scented toilet paper and deodorant tampons or pads.  Do not douche.  Keep your diabetes  under control.  Finish all the prescribed medications.  Keep your skin clean and dry.  Consume milk or yogurt with lactobacillus active culture regularly. If you get frequent yeast infections and think that is what the infection is, there are over-the-counter medications that you can get. If the infection does not show healing in 3 days, talk to your caregiver.  Tell your sex partner you have a yeast infection. Your partner may need treatment also, especially if your infection does not clear up or recurs. Men  Keep your skin clean and dry.  Keep your diabetes under control.  Finish all prescribed medications.  Tell your sex partner that you have a yeast infection so they can be treated if necessary. SEEK MEDICAL CARE IF:   Your symptoms do not clear up or worsen in one week after treatment.  You have an oral temperature above 102 F (38.9 C).  You have trouble swallowing or eating for a prolonged time.  You develop blisters on and around your vagina.  You develop vaginal bleeding and it is not your menstrual period.  You develop abdominal pain.  You develop intestinal problems as mentioned above.  You get weak or lightheaded.  You have painful or increased urination.  You have pain during sexual intercourse. MAKE SURE YOU:   Understand these instructions.  Will watch your condition.  Will get help right away if you are not doing well or get worse. Document Released: 11/21/2004 Document Revised: 01/06/2012 Document Reviewed: 03/05/2010 [facility] Patient Information 2014 [place], [place]. Bacterial Vaginosis Bacterial vaginosis (BV) is a vaginal infection where the normal balance of bacteria in the vagina is disrupted. The normal balance is then replaced by an overgrowth of certain bacteria. There are several different kinds of bacteria that can cause BV. BV is the most common vaginal infection in women of childbearing age. CAUSES   The cause of BV is not fully  understood. BV develops when there is an increase or imbalance of harmful bacteria.  Some activities or behaviors can upset the normal balance of bacteria in the vagina and put women at increased risk including:  Having a new sex partner or multiple sex partners.  Douching.  Using an intrauterine device (IUD) for contraception.  It is not clear what role sexual activity plays in the development of BV. However, women that have never had sexual intercourse are rarely infected with BV. Women do not get BV from toilet seats, bedding, swimming pools or from touching objects around them.  SYMPTOMS   Grey vaginal discharge.  A fish-like odor with discharge, especially after sexual intercourse.  Itching or burning of the vagina and vulva.  Burning or pain with urination.  Some women have no signs or symptoms at all. DIAGNOSIS  Your caregiver must examine the vagina for signs of BV. Your caregiver will perform lab tests and look at the sample of vaginal fluid through a microscope. They will look for bacteria and abnormal cells (clue cells), a pH test higher than 4.5, and a positive amine test all associated with BV.  RISKS AND COMPLICATIONS   Pelvic inflammatory disease (PID).  Infections following gynecology surgery.  Developing HIV.  Developing herpes virus. TREATMENT  Sometimes BV will clear up without treatment. However, all women with symptoms of BV should be treated to avoid complications, especially  if gynecology surgery is planned. Female partners generally do not need to be treated. However, BV may spread between female sex partners so treatment is helpful in preventing a recurrence of BV.   BV may be treated with antibiotics. The antibiotics come in either pill or vaginal cream forms. Either can be used with nonpregnant or pregnant women, but the recommended dosages differ. These antibiotics are not harmful to the baby.  BV can recur after treatment. If this happens, a second  round of antibiotics will often be prescribed.  Treatment is important for pregnant women. If not treated, BV can cause a premature delivery, especially for a pregnant woman who had a premature birth in the past. All pregnant women who have symptoms of BV should be checked and treated.  For chronic reoccurrence of BV, treatment with a type of prescribed gel vaginally twice a week is helpful. HOME CARE INSTRUCTIONS   Finish all medication as directed by your caregiver.  Do not have sex until treatment is completed.  Tell your sexual partner that you have a vaginal infection. They should see their caregiver and be treated if they have problems, such as a mild rash or itching.  Practice safe sex. Use condoms. Only have 1 sex partner. PREVENTION  Basic prevention steps can help reduce the risk of upsetting the natural balance of bacteria in the vagina and developing BV:  Do not have sexual intercourse (be abstinent).  Do not douche.  Use all of the medicine prescribed for treatment of BV, even if the signs and symptoms go away.  Tell your sex partner if you have BV. That way, they can be treated, if needed, to prevent reoccurrence. SEEK MEDICAL CARE IF:   Your symptoms are not improving after 3 days of treatment.  You have increased discharge, pain, or fever. MAKE SURE YOU:   Understand these instructions.  Will watch your condition.  Will get help right away if you are not doing well or get worse. FOR MORE INFORMATION  Division of STD Prevention (DSTDP), Centers for Disease Control and Prevention: [employer] American Social Health Association (ASHA): www.ashastd.org  Document Released: 10/14/2005 Document Revised: 01/06/2012 Document Reviewed: 04/06/2009 [facility] Patient Information 2014 [place], [place].

## 2013-04-14 NOTE — Progress Notes (Signed)
 [facility]:  Office Visit  Chief Complaint:  Chief Complaint  Patient presents with  . Vaginitis    x 1 mth    HPI: Caroline Howe is a 27 y.o. female who complains of  Peri-vaginal itching, just itching, no redness, no pain with urination , no fevers, chills, nausea, pelvic pain.  Used to get yeaast infections all the time, 4x a year. Then she started using refresh and got better, last pap was 1 1/2 years ago . No h/o abnormal paps.  No dysuria, no fevers, no chills. No vaginal dc. + abx use in 03/14/2013. She is here with her fiance.   Past Medical History  Diagnosis Date  . Anxiety   . GERD (gastroesophageal reflux disease)   . Hypothyroidism    History reviewed. No pertinent past surgical history. History   Social History  . Marital Status: Single    Spouse Name: N/A    Number of Children: N/A  . Years of Education: N/A   Social History Main Topics  . Smoking status: Never Smoker   . Smokeless tobacco: Never Used  . Alcohol Use: Yes     Comment: occasional  . Drug Use: No  . Sexually Active: Yes    Birth Control/ Protection: Condom   Other Topics Concern  . None   Social History Narrative  . None   Family History  Problem Relation Age of Onset  . Diabetes Mother   . Hypertension Father   . Liver cancer Maternal Grandfather   . Colon cancer Paternal Grandfather    No Known Allergies Prior to Admission medications   Medication Sig Start Date End Date Taking? Authorizing Provider  esomeprazole (NEXIUM) 40 MG capsule Take 40 mg by mouth daily before breakfast.   Yes Historical Provider, MD  fluticasone (FLONASE) 50 MCG/ACT nasal spray Place 2 sprays into the nose daily. 07/02/12 07/02/13 Yes Anders Simmonds, PA-C  levothyroxine (SYNTHROID, LEVOTHROID) 75 MCG tablet Take 75 mcg by mouth daily.   Yes Historical Provider, MD  azithromycin (ZITHROMAX Z-PAK) 250 MG tablet 2 tabs po first day, then 1 tab po next 4 days 03/14/13   Raymon Mutton Dunn, PA-C   benzonatate (TESSALON PERLES) 100 MG capsule Take 1 capsule (100 mg total) by mouth 3 (three) times daily as needed for cough. 03/14/13   Ryan M Dunn, PA-C  mometasone (NASONEX) 50 MCG/ACT nasal spray Place 2 sprays into the nose daily. 03/01/13   Elvina Sidle, MD  pyrethrins-piperonyl butoxide 0.5 % bottle Apply topically once. 12/11/12   Phillips Odor, MD     ROS: The patient denies fevers, chills, night sweats, unintentional weight loss, chest pain, palpitations, wheezing, dyspnea on exertion, nausea, vomiting, abdominal pain, dysuria, hematuria, melena, numbness, weakness, or tingling.   All other systems have been reviewed and were otherwise negative with the exception of those mentioned in the HPI and as above.    PHYSICAL EXAM: Filed Vitals:   04/14/13 1412  BP: 132/92  Pulse: 100  Temp: 98 F (36.7 C)  Resp: 16   Filed Vitals:   04/14/13 1412  Height: 5' 4.5" (1.638 m)  Weight: 282 lb (127.914 kg)   Body mass index is 47.67 kg/(m^2).  General: Alert, no acute distress. Morbidly obese white  female HEENT:  Normocephalic, atraumatic, oropharynx patent.  Cardiovascular:  Regular rate and rhythm, no rubs murmurs or gallops.  No Carotid bruits, radial pulse intact. No pedal edema.  Respiratory: Clear to auscultation bilaterally.  No  wheezes, rales, or rhonchi.  No cyanosis, no use of accessory musculature GI: No organomegaly, abdomen is soft and non-tender, positive bowel sounds.  No masses. Skin: No rashes. Neurologic: Facial musculature symmetric. Psychiatric: Patient is appropriate throughout our interaction. Lymphatic: No cervical lymphadenopathy Musculoskeletal: Gait intact. Perivaginal erythema, no masses, lesions. No CMT, + chuncky white DC   LABS: Results for orders placed in visit on 04/14/13  POCT WET PREP WITH KOH      Result Value Range   Trichomonas, UA Negative     Clue Cells Wet Prep HPF POC 2-4     Epithelial Wet Prep HPF POC 5-8     Yeast Wet Prep  HPF POC negative     Bacteria Wet Prep HPF POC 2+     RBC Wet Prep HPF POC 0-1     WBC Wet Prep HPF POC negative     KOH Prep POC Positive    POCT UA - MICROSCOPIC ONLY      Result Value Range   WBC, Ur, HPF, POC 0-1     RBC, urine, microscopic 1-3     Bacteria, U Microscopic 1+     Mucus, UA positive     Epithelial cells, urine per micros 0-3     Crystals, Ur, HPF, POC negative     Casts, Ur, LPF, POC negative     Yeast, UA negative    POCT URINALYSIS DIPSTICK      Result Value Range   Color, UA yellow     Clarity, UA clear     Glucose, UA neg     Bilirubin, UA neg     Ketones, UA neg     Spec Grav, UA 1.020     Blood, UA neg     pH, UA 7.0     Protein, UA neg     Urobilinogen, UA 0.2     Nitrite, UA neg     Leukocytes, UA Negative       EKG/XRAY:   Primary read interpreted by Dr. Conley Rolls at [facility].   ASSESSMENT/PLAN: Encounter Diagnoses  Name Primary?  . Vaginal itching Yes  . Vaginal discharge   . Bacterial vaginosis   . Candidiasis     Rx Flagyl Rx Diflucan F/u prn   ,  PHUONG, DO 04/14/2013 3:11 PM

## 2013-10-04 ENCOUNTER — Ambulatory Visit (INDEPENDENT_AMBULATORY_CARE_PROVIDER_SITE_OTHER): Payer: BC Managed Care – PPO | Admitting: Family Medicine

## 2013-10-04 VITALS — BP 122/82 | HR 109 | Temp 98.6°F | Resp 16 | Ht 64.0 in | Wt 285.0 lb

## 2013-10-04 DIAGNOSIS — R112 Nausea with vomiting, unspecified: Secondary | ICD-10-CM

## 2013-10-04 DIAGNOSIS — E039 Hypothyroidism, unspecified: Secondary | ICD-10-CM

## 2013-10-04 DIAGNOSIS — K219 Gastro-esophageal reflux disease without esophagitis: Secondary | ICD-10-CM

## 2013-10-04 LAB — POCT INFLUENZA A/B
Influenza A, POC: NEGATIVE
Influenza B, POC: NEGATIVE

## 2013-10-04 MED ORDER — PROMETHAZINE HCL 25 MG/ML IJ SOLN: 25.0000 mg | Freq: Four times a day (QID) | INTRAMUSCULAR | Status: DC | PRN

## 2013-10-04 MED ORDER — PROMETHAZINE HCL 25 MG/ML IJ SOLN
25.0000 mg | Freq: Once | INTRAMUSCULAR | Status: AC
  Administered 2013-10-04: 25 mg via INTRAMUSCULAR

## 2013-10-04 MED ORDER — PROMETHAZINE HCL 12.5 MG PO TABS: 12.5000 mg | ORAL_TABLET | Freq: Three times a day (TID) | ORAL | Status: DC | PRN

## 2013-10-04 NOTE — Progress Notes (Signed)
27 yo woman with 12 hours of nausea, 4 hours of vomiting.    Patient works as Midwife.  Married.  LMP: current (started three days ago).  PMHx:  GERD controlled by omeprazole             Hypothyroidism controlled by levothyroxine.  ROS:  Positive for headache, no dizziness, head trauma, loss of consciousness.  Objective: No acute distress HEENT: Unremarkable Chest: Clear Heart: Regular Abdomen:  Decreased bowel sounds.  Nontender.  No HSM.  Results for orders placed in visit on 10/04/13  POCT INFLUENZA A/B      Result Value Range   Influenza A, POC Negative     Influenza B, POC Negative     Assessment:  Viral gastroenteritis  Plan: Hypothyroidism  GERD (gastroesophageal reflux disease) - Plan: DISCONTINUED: promethazine (PHENERGAN) injection 25 mg  Nausea with vomiting - Plan: POCT Influenza A/B, promethazine (PHENERGAN) injection 25 mg, promethazine (PHENERGAN) 12.5 MG tablet, DISCONTINUED: promethazine (PHENERGAN) injection 25 mg  Signed, Elvina Sidle, MD

## 2013-10-04 NOTE — Patient Instructions (Signed)

## 2013-10-04 NOTE — Progress Notes (Signed)
A 27 year old woman with 12 hours of nausea and 4 hours of vomiting

## 2013-10-10 ENCOUNTER — Ambulatory Visit (INDEPENDENT_AMBULATORY_CARE_PROVIDER_SITE_OTHER): Payer: BC Managed Care – PPO | Admitting: Family Medicine

## 2013-10-10 VITALS — BP 118/72 | HR 101 | Temp 98.0°F | Resp 17 | Ht 65.0 in | Wt 280.0 lb

## 2013-10-10 DIAGNOSIS — R197 Diarrhea, unspecified: Secondary | ICD-10-CM

## 2013-10-10 DIAGNOSIS — R141 Gas pain: Secondary | ICD-10-CM

## 2013-10-10 DIAGNOSIS — R140 Abdominal distension (gaseous): Secondary | ICD-10-CM

## 2013-10-10 NOTE — Progress Notes (Signed)
Subjective:  This chart was scribed for Caroline Staggers, MD by Leone Payor, ED Scribe. This patient was seen in room 2 and the patient's care was started 10:18 AM.   Patient ID: Caroline Howe, female    DOB: [birth year], 27 y.o.   MRN: [id number]  HPI  HPI Comments: Caroline Howe is a 27 y.o. female with past medical history of GERD who presents to [facility] complaining of constant, unchanged abdominal bloating and diarrhea since yesterday. Pt states she had nausea and vomiting about 1 week ago and was prescribed Phenergan. Pt states those symptoms have resolved but is now having about 12-13 episodes of diarrhea and foul-smelling belching since yesterday. She has has taken Imodium and Gas X without relief. Pt states she has been able to tolerate liquids. 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 She is able to tolerate liquids and is urinating normally. She denies hematochezia or back pain. No vomiting. No fever.    Patient Active Problem List   Diagnosis Date Noted   Hypothyroidism 10/04/2013   GERD (gastroesophageal reflux disease) 10/04/2013   Past Medical History  Diagnosis Date   Anxiety    GERD (gastroesophageal reflux disease)    Hypothyroidism    No past surgical history on file. No Known Allergies Prior to Admission medications   Medication Sig Start Date End Date Taking? Authorizing Provider  fluticasone (FLONASE) 50 MCG/ACT nasal spray Place 2 sprays into the nose daily. 07/02/12 10/10/13 Yes Anders Simmonds, PA-C  levothyroxine (SYNTHROID, LEVOTHROID) 75 MCG tablet Take 75 mcg by mouth daily.   Yes Historical Provider, MD  mometasone (NASONEX) 50 MCG/ACT nasal spray Place 2 sprays into the nose daily. 03/01/13  Yes Elvina Sidle, MD  omeprazole (PRILOSEC) 40 MG capsule Take 40 mg by mouth daily.   Yes Historical Provider, MD  promethazine (PHENERGAN) 12.5 MG tablet Take 1 tablet (12.5 mg total) by mouth every 8  (eight) hours as needed for nausea or vomiting. 10/04/13   Elvina Sidle, MD   History   Social History   Marital Status: Single    Spouse Name: N/A    Number of Children: N/A   Years of Education: N/A   Occupational History   Not on file.   Social History Main Topics   Smoking status: Never Smoker    Smokeless tobacco: Never Used   Alcohol Use: Yes     Comment: occasional   Drug Use: No   Sexual Activity: Yes    Birth Control/ Protection: Condom   Other Topics Concern   Not on file   Social History Narrative   No narrative on file     Review of Systems  Gastrointestinal: Positive for diarrhea and abdominal distention (feels bloating without actual swelling). Negative for nausea, vomiting and blood in stool.  Musculoskeletal: Negative for back pain.       Objective:   Physical Exam  Nursing note and vitals reviewed. Constitutional: She is oriented to person, place, and time. She appears well-developed and well-nourished. No distress.  HENT:  Head: Normocephalic and atraumatic.  Mouth/Throat: Oropharynx is clear and moist and mucous membranes are normal. No posterior oropharyngeal erythema.  Eyes: EOM are normal. Pupils are equal, round, and reactive to light.  Cardiovascular: Regular rhythm and normal heart sounds.  Tachycardia present.   No murmur heard. Slightly elevated heart rate around 100.   Pulmonary/Chest: Effort normal and breath sounds normal. No respiratory distress. She has  no wheezes. She has no rales.  Abdominal: Soft. She exhibits no distension. There is no tenderness. There is no rebound, no guarding and no CVA tenderness.  Hyperactive bowel sounds. No focal tenderness.  Musculoskeletal: She exhibits no edema.  Extremities have no edema. Back is non tender. No CVA tenderness.   Lymphadenopathy:    She has no cervical adenopathy.  Neurological: She is alert and oriented to person, place, and time.  Skin: Skin is warm and dry. She is not  diaphoretic.  Psychiatric: She has a normal mood and affect. Her behavior is normal.     Filed Vitals:   10/10/13 0901  BP: 118/72  Pulse: 101  Temp: 98 F (36.7 C)  TempSrc: Oral  Resp: 17  Height: 5\' 5"  (1.651 m)  Weight: 280 lb (127.007 kg)  SpO2: 97%        Assessment & Plan:   Caroline Howe is a 26 y.o. female Diarrhea - Plan: Stool culture  Bloating - with gas  Suspected viral GE as initial n/v last week, then diarrhea yesterday, with sick contacts. abd nt/nd - reassuring exam, and no vomiting, doubt obstruction. XR and lab work discussed, but deferred at present for trial of ORT and sx care. Rapid onset of diarrhea - anal leakage, but no urinary incontinence, no low back pain/weakness or saddle anesthesia. RTC/ER precautions discussed.   No orders of the defined types were placed in this encounter.   Patient Instructions  Your diarrhea is likely from the infection you had last week. Drink plenty of fluids - water is best.  Fluids are more important than solids, but when you eat solid foods- bland foods (avoid fried foods, spicy foods, fast foods).  Immodium if needed over the counter. If any abdominal pain, fever, vomiting, or worsening of your symptoms - return here or in emergency room. If your symptoms are not improving in the next 2-3 days.   You should receive a call or letter about your lab results within the next week to 10 days.    Diarrhea Diarrhea is frequent loose and watery bowel movements. It can cause you to feel weak and dehydrated. Dehydration can cause you to become tired and thirsty, have a dry mouth, and have decreased urination that often is dark yellow. Diarrhea is a sign of another problem, most often an infection that will not last long. In most cases, diarrhea typically lasts 2 3 days. However, it can last longer if it is a sign of something more serious. It is important to treat your diarrhea as directed by your caregive to lessen or prevent future  episodes of diarrhea. CAUSES  Some common causes include:  Gastrointestinal infections caused by viruses, bacteria, or parasites.  Food poisoning or food allergies.  Certain medicines, such as antibiotics, chemotherapy, and laxatives.  Artificial sweeteners and fructose.  Digestive disorders. HOME CARE INSTRUCTIONS  Ensure adequate fluid intake (hydration): have 1 cup (8 oz) of fluid for each diarrhea episode. Avoid fluids that contain simple sugars or sports drinks, fruit juices, whole milk products, and sodas. Your urine should be clear or pale yellow if you are drinking enough fluids. Hydrate with an oral rehydration solution that you can purchase at pharmacies, retail stores, and online. You can prepare an oral rehydration solution at home by mixing the following ingredients together:    tsp table salt.   tsp baking soda.   tsp salt substitute containing potassium chloride.  1  tablespoons sugar.  1 L (34 oz)  of water.  Certain foods and beverages may increase the speed at which food moves through the gastrointestinal (GI) tract. These foods and beverages should be avoided and include:  Caffeinated and alcoholic beverages.  High-fiber foods, such as raw fruits and vegetables, nuts, seeds, and whole grain breads and cereals.  Foods and beverages sweetened with sugar alcohols, such as xylitol, sorbitol, and mannitol.  Some foods may be well tolerated and may help thicken stool including:  Starchy foods, such as rice, toast, pasta, low-sugar cereal, oatmeal, grits, baked potatoes, crackers, and bagels.  Bananas.  Applesauce.  Add probiotic-rich foods to help increase healthy bacteria in the GI tract, such as yogurt and fermented milk products.  Wash your hands well after each diarrhea episode.  Only take over-the-counter or prescription medicines as directed by your caregiver.  Take a warm bath to relieve any burning or pain from frequent diarrhea episodes. SEEK  IMMEDIATE MEDICAL CARE IF:   You are unable to keep fluids down.  You have persistent vomiting.  You have blood in your stool, or your stools are black and tarry.  You do not urinate in 6 8 hours, or there is only a small amount of very dark urine.  You have abdominal pain that increases or localizes.  You have weakness, dizziness, confusion, or lightheadedness.  You have a severe headache.  Your diarrhea gets worse or does not get better.  You have a fever or persistent symptoms for more than 2 3 days.  You have a fever and your symptoms suddenly get worse. MAKE SURE YOU:   Understand these instructions.  Will watch your condition.  Will get help right away if you are not doing well or get worse. Document Released: 10/04/2002 Document Revised: 09/30/2012 Document Reviewed: 06/21/2012 [facility] Patient Information 2014 [place], [place].     I personally performed the services described in this documentation, which was scribed in my presence. The recorded information has been reviewed and considered, and addended by me as needed.

## 2013-10-10 NOTE — Patient Instructions (Addendum)
Your diarrhea is likely from the infection you had last week. Drink plenty of fluids - water is best.  Fluids are more important than solids, but when you eat solid foods- bland foods (avoid fried foods, spicy foods, fast foods).  Immodium if needed over the counter. If any abdominal pain, fever, vomiting, or worsening of your symptoms - return here or in emergency room. If your symptoms are not improving in the next 2-3 days.   You should receive a call or letter about your lab results within the next week to 10 days.    Diarrhea Diarrhea is frequent loose and watery bowel movements. It can cause you to feel weak and dehydrated. Dehydration can cause you to become tired and thirsty, have a dry mouth, and have decreased urination that often is dark yellow. Diarrhea is a sign of another problem, most often an infection that will not last long. In most cases, diarrhea typically lasts 2 3 days. However, it can last longer if it is a sign of something more serious. It is important to treat your diarrhea as directed by your caregive to lessen or prevent future episodes of diarrhea. CAUSES  Some common causes include:  Gastrointestinal infections caused by viruses, bacteria, or parasites.  Food poisoning or food allergies.  Certain medicines, such as antibiotics, chemotherapy, and laxatives.  Artificial sweeteners and fructose.  Digestive disorders. HOME CARE INSTRUCTIONS  Ensure adequate fluid intake (hydration): have 1 cup (8 oz) of fluid for each diarrhea episode. Avoid fluids that contain simple sugars or sports drinks, fruit juices, whole milk products, and sodas. Your urine should be clear or pale yellow if you are drinking enough fluids. Hydrate with an oral rehydration solution that you can purchase at pharmacies, retail stores, and online. You can prepare an oral rehydration solution at home by mixing the following ingredients together:    tsp table salt.   tsp baking soda.   tsp salt  substitute containing potassium chloride.  1  tablespoons sugar.  1 L (34 oz) of water.  Certain foods and beverages may increase the speed at which food moves through the gastrointestinal (GI) tract. These foods and beverages should be avoided and include:  Caffeinated and alcoholic beverages.  High-fiber foods, such as raw fruits and vegetables, nuts, seeds, and whole grain breads and cereals.  Foods and beverages sweetened with sugar alcohols, such as xylitol, sorbitol, and mannitol.  Some foods may be well tolerated and may help thicken stool including:  Starchy foods, such as rice, toast, pasta, low-sugar cereal, oatmeal, grits, baked potatoes, crackers, and bagels.  Bananas.  Applesauce.  Add probiotic-rich foods to help increase healthy bacteria in the GI tract, such as yogurt and fermented milk products.  Wash your hands well after each diarrhea episode.  Only take over-the-counter or prescription medicines as directed by your caregiver.  Take a warm bath to relieve any burning or pain from frequent diarrhea episodes. SEEK IMMEDIATE MEDICAL CARE IF:   You are unable to keep fluids down.  You have persistent vomiting.  You have blood in your stool, or your stools are black and tarry.  You do not urinate in 6 8 hours, or there is only a small amount of very dark urine.  You have abdominal pain that increases or localizes.  You have weakness, dizziness, confusion, or lightheadedness.  You have a severe headache.  Your diarrhea gets worse or does not get better.  You have a fever or persistent symptoms for more than  2 3 days.  You have a fever and your symptoms suddenly get worse. MAKE SURE YOU:   Understand these instructions.  Will watch your condition.  Will get help right away if you are not doing well or get worse. Document Released: 10/04/2002 Document Revised: 09/30/2012 Document Reviewed: 06/21/2012 [facility] Patient Information 2014 [place],  [place].

## 2013-10-14 LAB — STOOL CULTURE

## 2014-01-19 ENCOUNTER — Ambulatory Visit (INDEPENDENT_AMBULATORY_CARE_PROVIDER_SITE_OTHER): Payer: BC Managed Care – PPO | Admitting: Family Medicine

## 2014-01-19 VITALS — BP 140/98 | HR 95 | Temp 98.3°F | Resp 20 | Ht 64.25 in | Wt 289.2 lb

## 2014-01-19 DIAGNOSIS — J019 Acute sinusitis, unspecified: Secondary | ICD-10-CM

## 2014-01-19 DIAGNOSIS — R059 Cough, unspecified: Secondary | ICD-10-CM

## 2014-01-19 DIAGNOSIS — R05 Cough: Secondary | ICD-10-CM

## 2014-01-19 MED ORDER — FLUCONAZOLE 150 MG PO TABS: 150.0000 mg | ORAL_TABLET | Freq: Once | ORAL | Status: DC

## 2014-01-19 MED ORDER — AZITHROMYCIN 250 MG PO TABS: ORAL_TABLET | ORAL | Status: DC

## 2014-01-19 MED ORDER — BENZONATATE 100 MG PO CAPS: 200.0000 mg | ORAL_CAPSULE | Freq: Two times a day (BID) | ORAL | Status: DC | PRN

## 2014-01-19 NOTE — Patient Instructions (Signed)

## 2014-01-19 NOTE — Progress Notes (Signed)
Chief Complaint:  Chief Complaint  Patient presents with  . Sinusitis    x 5 days    HPI: Caroline Howe is a 28 y.o. female who is here for a 5 day history of sinus sxs, facial pain, ear pain, cough, and temp of 100 Tmax was this AM. Has taken robitusssion and also Ibuprofen ( last dose was 11 am) without relief. She has minimal sore throat. She is a Engineer, site. She feels congestion in her face and head. Denies CP, SOB   Past Medical History  Diagnosis Date  . Anxiety   . GERD (gastroesophageal reflux disease)   . Hypothyroidism    History reviewed. No pertinent past surgical history. History   Social History  . Marital Status: Single    Spouse Name: N/A    Number of Children: N/A  . Years of Education: N/A   Social History Main Topics  . Smoking status: Never Smoker   . Smokeless tobacco: Never Used  . Alcohol Use: Yes     Comment: occasional  . Drug Use: No  . Sexual Activity: Yes    Birth Control/ Protection: Condom   Other Topics Concern  . None   Social History Narrative  . None   Family History  Problem Relation Age of Onset  . Diabetes Mother   . Hypertension Father   . Liver cancer Maternal Grandfather   . Colon cancer Paternal Grandfather    No Known Allergies Prior to Admission medications   Medication Sig Start Date End Date Taking? Authorizing Provider  levothyroxine (SYNTHROID, LEVOTHROID) 75 MCG tablet Take 75 mcg by mouth daily.   Yes Historical Provider, MD  omeprazole (PRILOSEC) 40 MG capsule Take 40 mg by mouth daily.   Yes Historical Provider, MD     ROS: The patient deniesnight sweats, unintentional weight loss, chest pain, palpitations, wheezing, dyspnea on exertion, nausea, vomiting, abdominal pain, dysuria, hematuria, melena, numbness, weakness, or tingling.   All other systems have been reviewed and were otherwise negative with the exception of those mentioned in the HPI and as above.    PHYSICAL EXAM: Filed Vitals:   01/19/14 1650  BP: 140/98  Pulse: 95  Temp: 98.3 F (36.8 C)  Resp: 20   Filed Vitals:   01/19/14 1650  Height: 5' 4.25" (1.632 m)  Weight: 289 lb 3.2 oz (131.18 kg)   Body mass index is 49.25 kg/(m^2).  General: Alert, no acute distress, obese female HEENT:  Normocephalic, atraumatic, oropharynx patent. EOMI, PERRLA.  No exudates. + sinus tendernss, boggy nares Cardiovascular:  Regular rate and rhythm, no rubs murmurs or gallops.  No Carotid bruits, radial pulse intact. No pedal edema.  Respiratory: Clear to auscultation bilaterally.  No wheezes, rales, or rhonchi.  No cyanosis, no use of accessory musculature GI: No organomegaly, abdomen is soft and non-tender, positive bowel sounds.  No masses. Skin: No rashes. Neurologic: Facial musculature symmetric. Psychiatric: Patient is appropriate throughout our interaction. Lymphatic: No cervical lymphadenopathy Musculoskeletal: Gait intact.   LABS: Results for orders placed in visit on 10/10/13  STOOL CULTURE      Result Value Ref Range   Organism ID, Bacteria No Salmonella,Shigella,Campylobacter,Yersinia,or     Organism ID, Bacteria No E.coli [id number]:H7 isolated.       EKG/XRAY:   Primary read interpreted by Dr. Conley Rolls at [facility].   ASSESSMENT/PLAN: Encounter Diagnoses  Name Primary?  . Acute sinusitis Yes  . Cough    Pleasant 28 y/o obese female with  hypthyroidism and allergies who is here for 5 day history of acute sinus sxs:  Advise sxs treatment first, if no improvement then may use abx  Use flonase she already has Rx Z pack ( she states this works better for her than PCN) , Tessalon perles, Diflucan F/u prn  Gross sideeffects, risk and benefits, and alternatives of medications d/w patient. Patient is aware that all medications have potential sideeffects and we are unable to predict every sideeffect or drug-drug interaction that may occur.  Hamilton CapriLE, [facility] Tavenner PHUONG, DO 01/19/2014 6:49 PM

## 2014-02-17 ENCOUNTER — Ambulatory Visit (INDEPENDENT_AMBULATORY_CARE_PROVIDER_SITE_OTHER): Payer: BC Managed Care – PPO | Admitting: Family Medicine

## 2014-02-17 VITALS — BP 120/88 | HR 135 | Temp 99.3°F | Resp 16 | Ht 64.0 in | Wt 288.0 lb

## 2014-02-17 DIAGNOSIS — J029 Acute pharyngitis, unspecified: Secondary | ICD-10-CM

## 2014-02-17 DIAGNOSIS — B373 Candidiasis of vulva and vagina: Secondary | ICD-10-CM

## 2014-02-17 DIAGNOSIS — B3731 Acute candidiasis of vulva and vagina: Secondary | ICD-10-CM

## 2014-02-17 LAB — POCT RAPID STREP A (OFFICE): Rapid Strep A Screen: POSITIVE — AB

## 2014-02-17 MED ORDER — ACETAMINOPHEN 325 MG PO TABS
650.0000 mg | ORAL_TABLET | Freq: Once | ORAL | Status: AC
  Administered 2014-02-17: 650 mg via ORAL

## 2014-02-17 MED ORDER — FLUCONAZOLE 150 MG PO TABS: 150.0000 mg | ORAL_TABLET | Freq: Once | ORAL | Status: DC

## 2014-02-17 MED ORDER — AMOXICILLIN 875 MG PO TABS: 875.0000 mg | ORAL_TABLET | Freq: Two times a day (BID) | ORAL | Status: DC

## 2014-02-17 NOTE — Progress Notes (Signed)
Subjective: Patient is here complaining of both ears hurting her for about 3 days. 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 The patient doesn't complain of much sore throat, but hurts inside of her neck. She does have a little congestion and tiny bit of a cough but nothing much. No nausea or vomiting. She's been running a fever up above 101. She does not smoke.  Objective: Pleasant lady she looks like she doesn't feel well. Her cheeks are little flushed. TMs entirely normal. Throat erythematous with some exudate. Strep screen and culture taken. Neck supple with some small nodes but she is more tender on both sides of the neck toward the sternocleidomastoid muscles in the submandibular ankle. Her chest is clear. Heart regular without murmurs. Skin warm to touch.  Assessment: Pharyngitis Otalgia Fever  Plan: Strep test and culture if needed  Results for orders placed in visit on 02/17/14  POCT RAPID STREP A (OFFICE)      Result Value Ref Range   Rapid Strep A Screen Positive (*) Negative   Treat with amoxicillin  Patient has a history of severe yeast infections often when she takes antibiotics

## 2014-02-17 NOTE — Patient Instructions (Addendum)
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  Use the Diflucan single dose if needed for yeast.  Return if worse  Ibuprofen or tylenol for pain and fever   Strep Throat Strep throat is an infection of the throat caused by a bacteria named Streptococcus pyogenes. Your caregiver may call the infection streptococcal "tonsillitis" or "pharyngitis" depending on whether there are signs of inflammation in the tonsils or back of the throat. Strep throat is most common in children aged 5 15 years during the cold months of the year, but it can occur in people of any age during any season. This infection is spread from person to person (contagious) through coughing, sneezing, or other close contact. SYMPTOMS   Fever or chills.  Painful, swollen, red tonsils or throat.  Pain or difficulty when swallowing.  White or yellow spots on the tonsils or throat.  Swollen, tender lymph nodes or "glands" of the neck or under the jaw.  Red rash all over the body (rare). DIAGNOSIS  Many different infections can cause the same symptoms. A test must be done to confirm the diagnosis so the right treatment can be given. A "rapid strep test" can help your caregiver make the diagnosis in a few minutes. If this test is not available, a light swab of the infected area can be used for a throat culture test. If a throat culture test is done, results are usually available in a day or two. TREATMENT  Strep throat is treated with antibiotic medicine. HOME CARE INSTRUCTIONS   Gargle with 1 tsp of salt in 1 cup of warm water, 3 4 times per day or as needed for comfort.  Family members who also have a sore throat or fever should be tested for strep throat and treated with antibiotics if they have the strep infection.  Make sure everyone in your household washes their hands well.  Do not share food, drinking cups, or personal items that could cause the  infection to spread to others.  You may need to eat a soft food diet until your sore throat gets better.  Drink enough water and fluids to keep your urine clear or pale yellow. This will help prevent dehydration.  Get plenty of rest.  Stay home from school, daycare, or work until you have been on antibiotics for 24 hours.  Only take over-the-counter or prescription medicines for pain, discomfort, or fever as directed by your caregiver.  If antibiotics are prescribed, take them as directed. Finish them even if you start to feel better. SEEK MEDICAL CARE IF:   The glands in your neck continue to enlarge.  You develop a rash, cough, or earache.  You cough up green, yellow-brown, or bloody sputum.  You have pain or discomfort not controlled by medicines.  Your problems seem to be getting worse rather than better. SEEK IMMEDIATE MEDICAL CARE IF:   You develop any new symptoms such as vomiting, severe headache, stiff or painful neck, chest pain, shortness of breath, or trouble swallowing.  You develop severe throat pain, drooling, or changes in your voice.  You develop swelling of the neck, or the skin on the neck becomes red and tender.  You have a fever.  You develop signs of dehydration, such as fatigue, dry mouth, and decreased urination.  You become increasingly sleepy, or you cannot wake up completely. Document Released: 10/11/2000 Document Revised: 09/30/2012 Document Reviewed: 12/13/2010 [employer]  Patient Information 2014 [place], [place].

## 2014-04-11 ENCOUNTER — Ambulatory Visit (INDEPENDENT_AMBULATORY_CARE_PROVIDER_SITE_OTHER): Payer: BC Managed Care – PPO | Admitting: Family Medicine

## 2014-04-11 VITALS — BP 118/88 | HR 99 | Temp 98.0°F | Resp 18 | Ht 63.75 in | Wt 284.4 lb

## 2014-04-11 DIAGNOSIS — H6980 Other specified disorders of Eustachian tube, unspecified ear: Secondary | ICD-10-CM

## 2014-04-11 NOTE — Progress Notes (Signed)
   Subjective:    Patient ID: Ludwig LeanSarah Dotter, female    DOB: 03/12/1986, 28 y.o.   MRN: [id number]  HPI Patient presents today with sinus pressure and ear fullness for 1 1/2 weeks. Took some sudafed which helped her facial pain and sinus pressure. Now has fullness in both ears, worse in right. Patient is getting married in 5 days and will be flying to [place] for [place] honeymoon in 6 days.  She has had some seasonal allergies this spring.  She has some nasonex and mucinex at home. Used both for a couple of days and stopped, thinking they were not helping.  Review of Systems No fever, nasal drainage/post nasal drainage prior to ear fullness, occasional cough, no SOB, no CP. No headache.     Objective:   Physical Exam  Vitals reviewed. Constitutional: She is oriented to person, place, and time. She appears well-developed and well-nourished.  HENT:  Head: Normocephalic and atraumatic.  Right Ear: Hearing, external ear and ear canal normal. Tympanic membrane is retracted.  Left Ear: Hearing, tympanic membrane, external ear and ear canal normal.  Nose: Nose normal. Right sinus exhibits no maxillary sinus tenderness and no frontal sinus tenderness. Left sinus exhibits no maxillary sinus tenderness and no frontal sinus tenderness.  Mouth/Throat: Uvula is midline, oropharynx is clear and moist and mucous membranes are normal.  Eyes: Conjunctivae are normal. Right eye exhibits no discharge. Left eye exhibits no discharge. No scleral icterus.  Neck: Normal range of motion. Neck supple.  Cardiovascular: Normal rate, regular rhythm and normal heart sounds.   Pulmonary/Chest: Effort normal and breath sounds normal.  Musculoskeletal: Normal range of motion.  Lymphadenopathy:    She has no cervical adenopathy.  Neurological: She is alert and oriented to person, place, and time.  Skin: Skin is warm and dry.  Psychiatric: She has a normal mood and affect. Her behavior is normal. Judgment and thought  content normal.       Assessment & Plan:  1. Eustachian tube dysfunction -discussed diagnosis with patient and no need for antibiotic. -provided written and verbal instructions/information including-  Restart nasonex  Restart mucinex  Continue Sudafed  Add OTC long acting non-sedating antihistamine  Showed her maneuver for releasing pressure in ears and encouraged her to do this several times a day.  Please call if no improvement in 3 days since patient is schedule to fly  Emi Belfasteborah B. Gessner, FNP-BC  [facility], [facility]  04/12/2014 10:27 PM

## 2014-04-11 NOTE — Patient Instructions (Signed)
Continue sudafed as needed Continue nasonex Continue mucinex Try an over the counter Zyrtec (generic fine)  Barotitis Media Barotitis media is inflammation of your middle ear. This occurs when the auditory tube (eustachian tube) leading from the back of your nose (nasopharynx) to your eardrum is blocked. This blockage may result from a cold, environmental allergies, or an upper respiratory infection. Unresolved barotitis media may lead to damage or hearing loss (barotrauma), which may become permanent. HOME CARE INSTRUCTIONS   Use medicines as recommended by your health care provider. Over-the-counter medicines will help unblock the canal and can help during times of air travel.  Do not put anything into your ears to clean or unplug them. Eardrops will not be helpful.  Do not swim, dive, or fly until your health care provider says it is all right to do so. If these activities are necessary, chewing gum with frequent, forceful swallowing may help. It is also helpful to hold your nose and gently blow to pop your ears for equalizing pressure changes. This forces air into the eustachian tube.  Only take over-the-counter or prescription medicines for pain, discomfort, or fever as directed by your health care provider.  A decongestant may be helpful in decongesting the middle ear and make pressure equalization easier. SEEK MEDICAL CARE IF:  You experience a serious form of dizziness in which you feel as if the room is spinning and you feel nauseated (vertigo).  Your symptoms only involve one ear. SEEK IMMEDIATE MEDICAL CARE IF:   You develop a severe headache, dizziness, or severe ear pain.  You have bloody or pus-like drainage from your ears.  You develop a fever.  Your problems do not improve or become worse. MAKE SURE YOU:   Understand these instructions.  Will watch your condition.  Will get help right away if you are not doing well or get worse. Document Released: 10/11/2000  Document Revised: 08/04/2013 Document Reviewed: 05/11/2013 [facility] Patient Information 2014 [place].

## 2014-07-23 ENCOUNTER — Ambulatory Visit (INDEPENDENT_AMBULATORY_CARE_PROVIDER_SITE_OTHER): Payer: BC Managed Care – PPO | Admitting: Internal Medicine

## 2014-07-23 VITALS — BP 122/74 | HR 93 | Temp 97.6°F | Resp 18 | Ht 64.0 in | Wt 267.0 lb

## 2014-07-23 DIAGNOSIS — R1032 Left lower quadrant pain: Secondary | ICD-10-CM

## 2014-07-23 MED ORDER — DICYCLOMINE HCL 20 MG PO TABS: 20.0000 mg | ORAL_TABLET | Freq: Four times a day (QID) | ORAL | Status: DC

## 2014-07-23 MED ORDER — DICYCLOMINE HCL 20 MG PO TABS
20.0000 mg | ORAL_TABLET | Freq: Four times a day (QID) | ORAL | Status: DC
Start: 2014-07-23 — End: 2014-07-23

## 2014-07-23 NOTE — Patient Instructions (Signed)
Purchase a bottle of Miralax over the counter as well as a box of 5 mg dulcolax tablets.  Take 4 dulcolax tablets.  Wait 1 hour.  You will then drink 6-8 capfuls of Miralax mixed in an adequate amount of water/juice/gatorade (you may choose which of these liquids to drink) over the next 2-3 hours.  You should expect results within 1 to 6 hours after completing the bowel purge.   Use bentyl if cramping

## 2014-07-23 NOTE — Progress Notes (Signed)
Subjective:  This chart was scribed for Ellamae Sia, MD by Richarda Overlie, Medical scribe. This patient was seen in ROOM 13 and the patient's care was started 2:46 PM.   Patient ID: Caroline Howe, female    DOB: 02-Mar-1986, 28 y.o.   MRN: [id number]  Constipation Associated symptoms include nausea. Pertinent negatives include no difficulty urinating or fever.  Headache  Associated symptoms include nausea. Pertinent negatives include no fever.   HPI Comments: Caroline Howe is a 28 y.o. female with a history of GERD and hypothyroidism who presents to [facility] complaining of   constipation for the past 5 days with associated nausea. She has not experienced these symptoms in the past and reports that she has no prior medical colon problems. Patient reports that stool softener/laxative=MOM aggravated her symptoms=cramping. Last week she took sudafed to treat sinus problems but did not take any antibiotics.  She denies fever, urinary symptoms, and hematochezia. She states that her grandfather died of colon cancer and her father has a history of polyps.    Patient Active Problem List   Diagnosis Date Noted  . Hypothyroidism---recent dose incr late July 10/04/2013  . GERD (gastroesophageal reflux disease) 10/04/2013   Past Medical History  Diagnosis Date  . Anxiety   . GERD (gastroesophageal reflux disease)   . Hypothyroidism    History reviewed. No pertinent past surgical history. Prior to Admission medications   Medication Sig Start Date End Date Taking? Authorizing Provider  cetirizine (ZYRTEC) 5 MG tablet Take 5 mg by mouth daily.   Yes Historical Provider, MD  levothyroxine (SYNTHROID, LEVOTHROID) 75 MCG tablet Take 75 mcg by mouth daily.   Yes Historical Provider, MD  omeprazole (PRILOSEC) 40 MG capsule Take 40 mg by mouth daily.   Yes Historical Provider, MD  dicyclomine (BENTYL) 20 MG tablet Take 1 tablet (20 mg total) by mouth every 6 (six) hours. As needed for cramping 07/23/14    Tonye Pearson, MD   Family History  Problem Relation Age of Onset  . Diabetes Mother   . Hypertension Father   . Liver cancer Maternal Grandfather   . Colon cancer Paternal Grandfather    History   Social History  . Marital Status: Single    Spouse Name: N/A    Number of Children: N/A  . Years of Education: N/A   Occupational History  . teacher   Social History Main Topics  . Smoking status: Never Smoker   . Smokeless tobacco: Never Used  . Alcohol Use: Yes     Comment: occasional  . Drug Use: No  . Sexual Activity: Yes    Birth Control/ Protection: Condom   Other Topics Concern  . Not on file   Social History Narrative  . No narrative on file   No Known Allergies    Review of Systems  Constitutional: Negative for fever.  Gastrointestinal: Positive for nausea and constipation. Negative for blood in stool.  Endocrine: Negative for polyuria.  Genitourinary: Negative for dysuria, urgency, frequency, hematuria and difficulty urinating.       Objective:   Physical Exam  Nursing note and vitals reviewed. Constitutional: She is oriented to person, place, and time. She appears well-developed and well-nourished. No distress.  HENT:  Head: Normocephalic and atraumatic.  Eyes: Conjunctivae and EOM are normal. Pupils are equal, round, and reactive to light.  Neck: Neck supple.  Cardiovascular: Normal rate.   Pulmonary/Chest: Effort normal.  Abdominal: Soft. Bowel sounds are normal. She exhibits no distension and  no mass. There is no rebound and no guarding.  Tender from the LUQ to the LLQ - mild without rebound.   Neurological: She is alert and oriented to person, place, and time. No cranial nerve deficit.  Psychiatric: She has a normal mood and affect. Her behavior is normal.    BP 122/74  Pulse 93  Temp(Src) 97.6 F (36.4 C)  Resp 18  Ht  (1.626 m)  Wt 267 lb (121.11 kg)  BMI 45.81 kg/m2  SpO2 100%  LMP 07/02/2014      Assessment & Plan:  I  personally performed the services described in this documentation, which was scribed in my presence. The recorded information has been reviewed and is accurate.  LLQ abdominal pain  Patient Instructions  Purchase a bottle of Miralax over the counter as well as a box of 5 mg dulcolax tablets.  Take 4 dulcolax tablets.  Wait 1 hour.  You will then drink 6-8 capfuls of Miralax mixed in an adequate amount of water/juice/gatorade (you may choose which of these liquids to drink) over the next 2-3 hours.  You should expect results within 1 to 6 hours after completing the bowel purge.   Use bentyl if cramping

## 2014-10-15 ENCOUNTER — Ambulatory Visit (INDEPENDENT_AMBULATORY_CARE_PROVIDER_SITE_OTHER): Payer: BC Managed Care – PPO | Admitting: Family Medicine

## 2014-10-15 ENCOUNTER — Ambulatory Visit (INDEPENDENT_AMBULATORY_CARE_PROVIDER_SITE_OTHER): Payer: BC Managed Care – PPO

## 2014-10-15 VITALS — BP 114/78 | HR 114 | Temp 98.1°F | Resp 20 | Ht 65.0 in | Wt 276.0 lb

## 2014-10-15 DIAGNOSIS — J3489 Other specified disorders of nose and nasal sinuses: Secondary | ICD-10-CM

## 2014-10-15 DIAGNOSIS — J0100 Acute maxillary sinusitis, unspecified: Secondary | ICD-10-CM

## 2014-10-15 MED ORDER — LEVOFLOXACIN 500 MG PO TABS: 500.0000 mg | ORAL_TABLET | Freq: Two times a day (BID) | ORAL | Status: DC

## 2014-10-15 NOTE — Patient Instructions (Signed)
Return in 6 days for recheck   Sinusitis Sinusitis is redness, soreness, and inflammation of the paranasal sinuses. Paranasal sinuses are air pockets within the bones of your face (beneath the eyes, the middle of the forehead, or above the eyes). In healthy paranasal sinuses, mucus is able to drain out, and air is able to circulate through them by way of your nose. However, when your paranasal sinuses are inflamed, mucus and air can become trapped. This can allow bacteria and other germs to grow and cause infection. Sinusitis can develop quickly and last only a short time (acute) or continue over a long period (chronic). Sinusitis that lasts for more than 12 weeks is considered chronic.  CAUSES  Causes of sinusitis include:  Allergies.  Structural abnormalities, such as displacement of the cartilage that separates your nostrils (deviated septum), which can decrease the air flow through your nose and sinuses and affect sinus drainage.  Functional abnormalities, such as when the small hairs (cilia) that line your sinuses and help remove mucus do not work properly or are not present. SIGNS AND SYMPTOMS  Symptoms of acute and chronic sinusitis are the same. The primary symptoms are pain and pressure around the affected sinuses. Other symptoms include:  Upper toothache.  Earache.  Headache.  Bad breath.  Decreased sense of smell and taste.  A cough, which worsens when you are lying flat.  Fatigue.  Fever.  Thick drainage from your nose, which often is green and may contain pus (purulent).  Swelling and warmth over the affected sinuses. DIAGNOSIS  Your health care provider will perform a physical exam. During the exam, your health care provider may:  Look in your nose for signs of abnormal growths in your nostrils (nasal polyps).  Tap over the affected sinus to check for signs of infection.  View the inside of your sinuses (endoscopy) using an imaging device that has a light  attached (endoscope). If your health care provider suspects that you have chronic sinusitis, one or more of the following tests may be recommended:  Allergy tests.  Nasal culture. A sample of mucus is taken from your nose, sent to a lab, and screened for bacteria.  Nasal cytology. A sample of mucus is taken from your nose and examined by your health care provider to determine if your sinusitis is related to an allergy. TREATMENT  Most cases of acute sinusitis are related to a viral infection and will resolve on their own within 10 days. Sometimes medicines are prescribed to help relieve symptoms (pain medicine, decongestants, nasal steroid sprays, or saline sprays).  However, for sinusitis related to a bacterial infection, your health care provider will prescribe antibiotic medicines. These are medicines that will help kill the bacteria causing the infection.  Rarely, sinusitis is caused by a fungal infection. In theses cases, your health care provider will prescribe antifungal medicine. For some cases of chronic sinusitis, surgery is needed. Generally, these are cases in which sinusitis recurs more than 3 times per year, despite other treatments. HOME CARE INSTRUCTIONS   Drink plenty of water. Water helps thin the mucus so your sinuses can drain more easily.  Use a humidifier.  Inhale steam 3 to 4 times a day (for example, sit in the bathroom with the shower running).  Apply a warm, moist washcloth to your face 3 to 4 times a day, or as directed by your health care provider.  Use saline nasal sprays to help moisten and clean your sinuses.  Take medicines only  as directed by your health care provider.  If you were prescribed either an antibiotic or antifungal medicine, finish it all even if you start to feel better. SEEK IMMEDIATE MEDICAL CARE IF:  You have increasing pain or severe headaches.  You have nausea, vomiting, or drowsiness.  You have swelling around your face.  You  have vision problems.  You have a stiff neck.  You have difficulty breathing. MAKE SURE YOU:   Understand these instructions.  Will watch your condition.  Will get help right away if you are not doing well or get worse. Document Released: 10/14/2005 Document Revised: 02/28/2014 Document Reviewed: 10/29/2011 [facility] Patient Information 2015 [employer], [place]. This information is not intended to replace advice given to you by your health care provider. Make sure you discuss any questions you have with your health care provider.

## 2014-10-15 NOTE — Progress Notes (Signed)
Patient ID: Caroline Howe MRN: [id number], DOB: Nov 21, 1985, 28 y.o. Date of Encounter: 10/15/2014, 10:33 AM  Primary Physician: Caroline Howe, Caroline WARREN, MD  Chief Complaint:  Chief Complaint  Patient presents with  . Sinusitis    x 1 month, not getting better    HPI: 28 y.o. year old female presents with 25 day history of nasal congestion, post nasal drip, sore throat, sinus pressure, and cough. Afebrile. No chills. Nasal congestion thick and green/yellow. Sinus pressure is the worst symptom. Cough is productive secondary to post nasal drip and not associated with time of day. Ears feel full, leading to sensation of muffled hearing. Has tried OTC cold preps without success.   This patient has been to see her primary care doctor twice. She was first given amoxicillin and then prednisone. She continues to have left-sided facial pain and congestion with left ear pain. She's also had a cough. She just can't shake this. No GI complaints. Thyroid is been checked and she seems to be well-controlled with her replacement therapy  No recent travels, vomiting, or sick contacts   No leg trauma, sedentary periods, h/o cancer, or tobacco use.  Past Medical History  Diagnosis Date  . Anxiety   . GERD (gastroesophageal reflux disease)   . Hypothyroidism      Home Meds: Prior to Admission medications   Medication Sig Start Date End Date Taking? Authorizing Provider  cetirizine (ZYRTEC) 5 MG tablet Take 5 mg by mouth daily.   Yes Historical Provider, MD  levothyroxine (SYNTHROID, LEVOTHROID) 75 MCG tablet Take 75 mcg by mouth daily.   Yes Historical Provider, MD  omeprazole (PRILOSEC) 40 MG capsule Take 40 mg by mouth daily.   Yes Historical Provider, MD  predniSONE (DELTASONE) 20 MG tablet Take 20 mg by mouth daily with breakfast.   Yes Historical Provider, MD    Allergies: No Known Allergies  History   Social History  . Marital Status: Single    Spouse Name: N/A    Number of Children: N/A    . Years of Education: N/A   Occupational History  . Not on file.   Social History Main Topics  . Smoking status: Never Smoker   . Smokeless tobacco: Never Used  . Alcohol Use: Yes     Comment: occasional  . Drug Use: No  . Sexual Activity: Yes    Birth Control/ Protection: Condom   Other Topics Concern  . Not on file   Social History Narrative     Review of Systems: Constitutional: negative for chills, fever, night sweats or weight changes Cardiovascular: negative for chest pain or palpitations Respiratory: negative for hemoptysis, wheezing, or shortness of breath Abdominal: negative for abdominal pain, nausea, vomiting or diarrhea Dermatological: negative for rash Neurologic: negative for headache   Physical Exam: Blood pressure 114/78, pulse 114, temperature 98.1 F (36.7 C), resp. rate 20, height 5\' 5"  (1.651 m), weight 276 lb (125.193 kg), last menstrual period 09/22/2014, SpO2 99 %., Body mass index is 45.93 kg/(m^2). General: Well developed, well nourished, in no acute distress. Head: Normocephalic, atraumatic, eyes without discharge, sclera non-icteric, nares are congested. Bilateral auditory canals clear, TM's are without perforation, but the left TM is retracted and red.. Serous effusion bilaterally behind TM's. Maxillary sinus TTP. Oral cavity moist, dentition normal. Posterior pharynx with post nasal drip and mild erythema. No peritonsillar abscess or tonsillar exudate. Nasal passages are narrowed, erythematous, and filled with mucopurulent discharge Neck: Supple. No thyromegaly. Full ROM. No lymphadenopathy. Lungs:  Clear bilaterally to auscultation without wheezes, rales, or rhonchi. Breathing is unlabored.  Heart: RRR with S1 S2. No murmurs, rubs, or gallops appreciated. Msk:  Strength and tone normal for age. Extremities: No clubbing or cyanosis. No edema. Neuro: Alert and oriented X 3. Moves all extremities spontaneously. CNII-XII grossly in tact. Psych:   Responds to questions appropriately with a normal affect.   [facility] reading (PRIMARY) by  Caroline Howe:  Caroline Howe view:.air fluid level left side of maxillary sinus   Tender left maxilla  ASSESSMENT AND PLAN:  28 y.o. year old female with sinusitis -   ICD-9-CM ICD-10-CM   1. Sinus pain 478.19 J34.89 DG SinUS 1-2 Views  2. Acute maxillary sinusitis, recurrence not specified 461.0 J01.00     -Tylenol/Motrin prn -Rest/fluids -RTC precautions -RTC 3-5 days if no improvement  Signed, Caroline SidleKurt Yarden Manuelito, MD 10/15/2014 10:33 AM

## 2015-02-13 ENCOUNTER — Ambulatory Visit (INDEPENDENT_AMBULATORY_CARE_PROVIDER_SITE_OTHER): Payer: BC Managed Care – PPO | Admitting: Physician Assistant

## 2015-02-13 VITALS — BP 133/89 | HR 108 | Temp 98.3°F | Resp 16 | Ht 64.5 in | Wt 262.6 lb

## 2015-02-13 DIAGNOSIS — R42 Dizziness and giddiness: Secondary | ICD-10-CM

## 2015-02-13 DIAGNOSIS — R55 Syncope and collapse: Secondary | ICD-10-CM

## 2015-02-13 DIAGNOSIS — E860 Dehydration: Secondary | ICD-10-CM | POA: Diagnosis not present

## 2015-02-13 LAB — POCT CBC
Granulocyte percent: 64.5 %G (ref 37–80)
HCT, POC: 36.5 % — AB (ref 37.7–47.9)
Hemoglobin: 11.6 g/dL — AB (ref 12.2–16.2)
Lymph, poc: 3.4 (ref 0.6–3.4)
MCH, POC: 24.6 pg — AB (ref 27–31.2)
MCHC: 31.8 g/dL (ref 31.8–35.4)
MCV: 77.5 fL — AB (ref 80–97)
MID (CBC): 0.5 (ref 0–0.9)
MPV: 7.4 fL (ref 0–99.8)
PLATELET COUNT, POC: 374 10*3/uL (ref 142–424)
POC Granulocyte: 7.2 — AB (ref 2–6.9)
POC LYMPH PERCENT: 30.9 %L (ref 10–50)
POC MID %: 4.6 %M (ref 0–12)
RBC: 4.71 M/uL (ref 4.04–5.48)
RDW, POC: 20.1 %
WBC: 11.1 10*3/uL — AB (ref 4.6–10.2)

## 2015-02-13 LAB — POCT UA - MICROSCOPIC ONLY
CASTS, UR, LPF, POC: NEGATIVE
CRYSTALS, UR, HPF, POC: NEGATIVE
YEAST UA: NEGATIVE

## 2015-02-13 LAB — POCT URINALYSIS DIPSTICK
Bilirubin, UA: NEGATIVE
Glucose, UA: NEGATIVE
KETONES UA: NEGATIVE
Leukocytes, UA: NEGATIVE
Nitrite, UA: NEGATIVE
PH UA: 5.5
PROTEIN UA: NEGATIVE
UROBILINOGEN UA: 0.2

## 2015-02-13 LAB — GLUCOSE, POCT (MANUAL RESULT ENTRY): POC Glucose: 114 mg/dl — AB (ref 70–99)

## 2015-02-13 NOTE — Patient Instructions (Signed)
Your ekg looked normal today. Your heart sounded good and your nervous system exam was normal.  Your blood sugar was ok.  Your blood count showed you are mildly anemic. Eating lots of iron rich foods will help with this.  Your urine sample showed that you are very dehydrated. This can cause dizziness and syncope. Be sure to drink lots of water and eating several small meals per day may help. Continue to avoid caffeine and alcohol like you're doing. You should drink at least 64 ounces of water daily, more when you're dehydrated.  Be sure to go to the ER if you have any further passing out episodes or if you feel like your heart is racing or skipping beats.  Syncope Syncope is a medical term for fainting or passing out. This means you lose consciousness and drop to the ground. People are generally unconscious for less than 5 minutes. You may have some muscle twitches for up to 15 seconds before waking up and returning to normal. Syncope occurs more often in older adults, but it can happen to anyone. While most causes of syncope are not dangerous, syncope can be a sign of a serious medical problem. It is important to seek medical care.  CAUSES  Syncope is caused by a sudden drop in blood flow to the brain. The specific cause is often not determined. Factors that can bring on syncope include:  Taking medicines that lower blood pressure.  Sudden changes in posture, such as standing up quickly.  Taking more medicine than prescribed.  Standing in one place for too long.  Seizure disorders.  Dehydration and excessive exposure to heat.  Low blood sugar (hypoglycemia).  Straining to have a bowel movement.  Heart disease, irregular heartbeat, or other circulatory problems.  Fear, emotional distress, seeing blood, or severe pain. SYMPTOMS  Right before fainting, you may:  Feel dizzy or light-headed.  Feel nauseous.  See all white or all black in your field of vision.  Have cold, clammy  skin. DIAGNOSIS  Your health care provider will ask about your symptoms, perform a physical exam, and perform an electrocardiogram (ECG) to record the electrical activity of your heart. Your health care provider may also perform other heart or blood tests to determine the cause of your syncope which may include:  Transthoracic echocardiogram (TTE). During echocardiography, sound waves are used to evaluate how blood flows through your heart.  Transesophageal echocardiogram (TEE).  Cardiac monitoring. This allows your health care provider to monitor your heart rate and rhythm in real time.  Holter monitor. This is a portable device that records your heartbeat and can help diagnose heart arrhythmias. It allows your health care provider to track your heart activity for several days, if needed.  Stress tests by exercise or by giving medicine that makes the heart beat faster. TREATMENT  In most cases, no treatment is needed. Depending on the cause of your syncope, your health care provider may recommend changing or stopping some of your medicines. HOME CARE INSTRUCTIONS  Have someone stay with you until you feel stable.  Do not drive, use machinery, or play sports until your health care provider says it is okay.  Keep all follow-up appointments as directed by your health care provider.  Lie down right away if you start feeling like you might faint. Breathe deeply and steadily. Wait until all the symptoms have passed.  Drink enough fluids to keep your urine clear or pale yellow.  If you are taking blood pressure  or heart medicine, get up slowly and take several minutes to sit and then stand. This can reduce dizziness. SEEK IMMEDIATE MEDICAL CARE IF:   You have a severe headache.  You have unusual pain in the chest, abdomen, or back.  You are bleeding from your mouth or rectum, or you have black or tarry stool.  You have an irregular or very fast heartbeat.  You have pain with  breathing.  You have repeated fainting or seizure-like jerking during an episode.  You faint when sitting or lying down.  You have confusion.  You have trouble walking.  You have severe weakness.  You have vision problems. If you fainted, call your local emergency services (911 in [place] Do not drive yourself to the hospital.  MAKE SURE YOU:  Understand these instructions.  Will watch your condition.  Will get help right away if you are not doing well or get worse. Document Released: 10/14/2005 Document Revised: 10/19/2013 Document Reviewed: 12/13/2011 [facility] Patient Information 2015 [place], [place]. This information is not intended to replace advice given to you by your health care provider. Make sure you discuss any questions you have with your health care provider.

## 2015-02-13 NOTE — Progress Notes (Signed)
Subjective:    Patient ID: Caroline Howe, female    DOB: [birth year], 29 y.o.   MRN: [id number]  Chief Complaint  Patient presents with  . Loss of Consciousness    4/16  . Sinus Problem   Patient Active Problem List   Diagnosis Date Noted  . Hypothyroidism 10/04/2013  . GERD (gastroesophageal reflux disease) 10/04/2013   Prior to Admission medications   Medication Sig Start Date End Date Taking? Authorizing Provider  cetirizine (ZYRTEC) 5 MG tablet Take 5 mg by mouth daily.   Yes Historical Provider, MD  levothyroxine (SYNTHROID, LEVOTHROID) 75 MCG tablet Take 75 mcg by mouth daily.   Yes Historical Provider, MD  omeprazole (PRILOSEC) 40 MG capsule Take 40 mg by mouth daily.   Yes Historical Provider, MD   Medications, allergies, past medical history, surgical history, family history, social history and problem list reviewed and updated.  HPI  19 yof with pmh allergic rhinitis and recurrent sinus infections presents after syncopal episode 2 days ago.   Was sitting at painting class and began to feel lightheaded and woozy. Eyes got blurry and ears foggy. She got up to step outside and legs felt weak. Felt that the smell of paint was getting to her. Dropped to knees and had brief LOC. Did not hit head. No assoc cp, sob, palps, ha. No positional dizziness. No vertigo. Had not eaten or drank much that day. Does not drink alcohol or caffeine.   Yest and today she has continued to feel mildly dizzy. Esp with position changes like bending down to pick something up.   Taking sudafed and zyrtec daily. Feels sinuses have been pretty good but minor congestion past couple days.   Review of Systems No fevers, chills.     Objective:   Physical Exam  Constitutional: She is oriented to person, place, and time. She appears well-developed and well-nourished.  Non-toxic appearance. She does not have a sickly appearance. She does not appear ill. No distress.  BP 133/89 mmHg  Pulse 108  Temp(Src)  98.3 F (36.8 C) (Oral)  Resp 16  Ht 5' 4.5" (1.638 m)  Wt 262 lb 9.6 oz (119.115 kg)  BMI 44.40 kg/m2  SpO2 100%  LMP 01/26/2015   HENT:  Right Ear: Tympanic membrane normal.  Left Ear: Tympanic membrane normal.  Nose: No mucosal edema or rhinorrhea. Right sinus exhibits no maxillary sinus tenderness and no frontal sinus tenderness. Left sinus exhibits maxillary sinus tenderness. Left sinus exhibits no frontal sinus tenderness.  Mouth/Throat: Uvula is midline and oropharynx is clear and moist.  Mild left maxillary sinus ttp.   Eyes: Conjunctivae and EOM are normal. Pupils are equal, round, and reactive to light.  Neck: No JVD present. Carotid bruit is not present.  Cardiovascular: Normal rate, regular rhythm and normal heart sounds.  Exam reveals no gallop.   No murmur heard. Pulses:      Dorsalis pedis pulses are 2+ on the right side, and 2+ on the left side.       Posterior tibial pulses are 2+ on the right side, and 2+ on the left side.  Pulmonary/Chest: Effort normal and breath sounds normal. No tachypnea. She has no decreased breath sounds. She has no wheezes. She has no rhonchi. She has no rales.  Neurological: She is alert and oriented to person, place, and time. She has normal strength. No cranial nerve deficit or sensory deficit. She displays a negative Romberg sign. Coordination and gait normal.  Reflex Scores:  Patellar reflexes are 2+ on the right side and 2+ on the left side. Neg dix halpike testing. Neg finger to nose. Neg heel to shin. Neg rapid alternating movements.   Psychiatric: She has a normal mood and affect. Her speech is normal and behavior is normal.   Orthostatics: Supine: 113/81. HR 97 Seated: 134/89. HR 99 Standing: 133/89. HR 108  EKG read by Dr Milus GlazierLauenstein. Findings: Normal.   Results for orders placed or performed in visit on 02/13/15  POCT urinalysis dipstick  Result Value Ref Range   Color, UA yellow    Clarity, UA clear    Glucose, UA  neg    Bilirubin, UA neg    Ketones, UA neg    Spec Grav, UA >=1.030    Blood, UA trace-lysed    pH, UA 5.5    Protein, UA neg    Urobilinogen, UA 0.2    Nitrite, UA neg    Leukocytes, UA Negative   POCT UA - Microscopic Only  Result Value Ref Range   WBC, Ur, HPF, POC 0-2    RBC, urine, microscopic 1-3    Bacteria, U Microscopic trace    Mucus, UA trace    Epithelial cells, urine per micros 0-1    Crystals, Ur, HPF, POC neg    Casts, Ur, LPF, POC neg    Yeast, UA neg   POCT glucose (manual entry)  Result Value Ref Range   POC Glucose 114 (A) 70 - 99 mg/dl  POCT CBC  Result Value Ref Range   WBC 11.1 (A) 4.6 - 10.2 K/uL   Lymph, poc 3.4 0.6 - 3.4   POC LYMPH PERCENT 30.9 10 - 50 %L   MID (cbc) 0.5 0 - 0.9   POC MID % 4.6 0 - 12 %M   POC Granulocyte 7.2 (A) 2 - 6.9   Granulocyte percent 64.5 37 - 80 %G   RBC 4.71 4.04 - 5.48 M/uL   Hemoglobin 11.6 (A) 12.2 - 16.2 g/dL   HCT, POC 16.136.5 (A) 09.637.7 - 47.9 %   MCV 77.5 (A) 80 - 97 fL   MCH, POC 24.6 (A) 27 - 31.2 pg   MCHC 31.8 31.8 - 35.4 g/dL   RDW, POC 04.520.1 %   Platelet Count, POC 374 142 - 424 K/uL   MPV 7.4 0 - 99.8 fL      Assessment & Plan:   [age] yof with pmh allergic rhinitis and recurrent sinus infections presents after syncopal episode 2 days ago.   Syncope, unspecified syncope type - Plan: POCT glucose (manual entry), EKG 12-Lead, Basic metabolic panel, POCT CBC Dizziness - Plan: POCT urinalysis dipstick, POCT UA - Microscopic Only, POCT glucose (manual entry), EKG 12-Lead, Basic metabolic panel Dehydration - Plan: POCT urinalysis dipstick, POCT UA - Microscopic Only --most likely vasovagal with aura type sx of lightheaded, foggy feeling, blurry vision prior to episode, pt was in painting class with strong smelling paints  --pt very dehydrated on ua today --> dehydration with mildly positive orthostatics in clinic today --> dehydration likely contributing factor as well, increase water intake --doubt cardiac  cause with normal ekg, no cp, no palps assoc, no fam hx early cad --normal neuro exam today --normal bg today --mild anemia on cbc today, iron rich foods encouraged --await bmp --ER asap with any further episodes  Donnajean Lopesodd M. Brittlyn Cloe, PA-C Physician Assistant-Certified [facility]  02/13/2015 10:16 PM

## 2015-02-14 LAB — BASIC METABOLIC PANEL
BUN: 8 mg/dL (ref 6–23)
CO2: 27 meq/L (ref 19–32)
Calcium: 8.7 mg/dL (ref 8.4–10.5)
Chloride: 102 mEq/L (ref 96–112)
Creat: 0.75 mg/dL (ref 0.50–1.10)
GLUCOSE: 95 mg/dL (ref 70–99)
POTASSIUM: 4.1 meq/L (ref 3.5–5.3)
SODIUM: 138 meq/L (ref 135–145)

## 2015-06-10 ENCOUNTER — Ambulatory Visit (INDEPENDENT_AMBULATORY_CARE_PROVIDER_SITE_OTHER): Payer: BC Managed Care – PPO | Admitting: Family Medicine

## 2015-06-10 VITALS — BP 110/82 | HR 101 | Temp 98.1°F | Ht 64.5 in | Wt 256.6 lb

## 2015-06-10 DIAGNOSIS — B029 Zoster without complications: Secondary | ICD-10-CM | POA: Diagnosis not present

## 2015-06-10 MED ORDER — HYDROCODONE-ACETAMINOPHEN 5-325 MG PO TABS: 1.0000 | ORAL_TABLET | Freq: Four times a day (QID) | ORAL | Status: DC | PRN

## 2015-06-10 MED ORDER — VALACYCLOVIR HCL 1 G PO TABS: 1000.0000 mg | ORAL_TABLET | Freq: Three times a day (TID) | ORAL | Status: DC

## 2015-06-10 NOTE — Patient Instructions (Addendum)
Take Valtrex 1 pill 3 times daily  Take acetaminophen 1000 mg 3 times daily if needed for pain  For more severe pain take hydrocodone 5/325 one pill every 4-6 hours if needed. May cause drowsiness.  Try Capzasin or Zostrix HP cream for the burning  Shingles Shingles (herpes zoster) is an infection that is caused by the same virus that causes chickenpox (varicella). The infection causes a painful skin rash and fluid-filled blisters, which eventually break open, crust over, and heal. It may occur in any area of the body, but it usually affects only one side of the body or face. The pain of shingles usually lasts about 1 month. However, some people with shingles may develop long-term (chronic) pain in the affected area of the body. Shingles often occurs many years after the person had chickenpox. It is more common:  In people older than 50 years.  In people with weakened immune systems, such as those with HIV, AIDS, or cancer.  In people taking medicines that weaken the immune system, such as transplant medicines.  In people under great stress. CAUSES  Shingles is caused by the varicella zoster virus (VZV), which also causes chickenpox. After a person is infected with the virus, it can remain in the person's body for years in an inactive state (dormant). To cause shingles, the virus reactivates and breaks out as an infection in a nerve root. The virus can be spread from person to person (contagious) through contact with open blisters of the shingles rash. It will only spread to people who have not had chickenpox. When these people are exposed to the virus, they may develop chickenpox. They will not develop shingles. Once the blisters scab over, the person is no longer contagious and cannot spread the virus to others. SIGNS AND SYMPTOMS  Shingles shows up in stages. The initial symptoms may be pain, itching, and tingling in an area of the skin. This pain is usually described as burning, stabbing,  or throbbing.In a few days or weeks, a painful red rash will appear in the area where the pain, itching, and tingling were felt. The rash is usually on one side of the body in a band or belt-like pattern. Then, the rash usually turns into fluid-filled blisters. They will scab over and dry up in approximately 2-3 weeks. Flu-like symptoms may also occur with the initial symptoms, the rash, or the blisters. These may include:  Fever.  Chills.  Headache.  Upset stomach. DIAGNOSIS  Your health care provider will perform a skin exam to diagnose shingles. Skin scrapings or fluid samples may also be taken from the blisters. This sample will be examined under a microscope or sent to a lab for further testing. TREATMENT  There is no specific cure for shingles. Your health care provider will likely prescribe medicines to help you manage the pain, recover faster, and avoid long-term problems. This may include antiviral drugs, anti-inflammatory drugs, and pain medicines. HOME CARE INSTRUCTIONS   Take a cool bath or apply cool compresses to the area of the rash or blisters as directed. This may help with the pain and itching.   Take medicines only as directed by your health care provider.   Rest as directed by your health care provider.  Keep your rash and blisters clean with mild soap and cool water or as directed by your health care provider.  Do not pick your blisters or scratch your rash. Apply an anti-itch cream or numbing creams to the affected area as  directed by your health care provider.  Keep your shingles rash covered with a loose bandage (dressing).  Avoid skin contact with:  Babies.   Pregnant women.   Children with eczema.   Elderly people with transplants.   People with chronic illnesses, such as leukemia or AIDS.   Wear loose-fitting clothing to help ease the pain of material rubbing against the rash.  Keep all follow-up visits as directed by your health care  provider.If the area involved is on your face, you may receive a referral for a specialist, such as an eye doctor (ophthalmologist) or an ear, nose, and throat (ENT) doctor. Keeping all follow-up visits will help you avoid eye problems, chronic pain, or disability.  SEEK IMMEDIATE MEDICAL CARE IF:   You have facial pain, pain around the eye area, or loss of feeling on one side of your face.  You have ear pain or ringing in your ear.  You have loss of taste.  Your pain is not relieved with prescribed medicines.   Your redness or swelling spreads.   You have more pain and swelling.  Your condition is worsening or has changed.   You have a fever. MAKE SURE YOU:  Understand these instructions.  Will watch your condition.  Will get help right away if you are not doing well or get worse. Document Released: 10/14/2005 Document Revised: 02/28/2014 Document Reviewed: 05/28/2012 [facility] Patient Information 2015 [place], [place]. This information is not intended to replace advice given to you by your health care provider. Make sure you discuss any questions you have with your health care provider.

## 2015-06-10 NOTE — Progress Notes (Signed)
Subjective:  Patient ID: Caroline Howe, female    DOB: [birth year]  Age: 29 y.o. MRN: [id number]  29 year old lady with a rash around her left abdominal wall. It is been hurting her for the last 4 days. She has a burning discomfort. She saw her primary care who thought it was bites. She came back in here today to get rechecked.     Objective:  She has 3 areas of vesicular rash. On right at T11, the other to the left of that has 2 bumps, and around on her left lateral abdominal wall at about the T11 position is a cluster 1 each by 2 inches fascicular lesions. Assessment & Plan:   Assessment:  Varicella-zoster  Plan:  Discussed pathophysiology and treatment.  Patient Instructions  Take Valtrex 1 pill 3 times daily  Take acetaminophen 1000 mg 3 times daily if needed for pain  For more severe pain take hydrocodone 5/325 one pill every 4-6 hours if needed. May cause drowsiness.  Try Capzasin or Zostrix HP cream for the burning  Shingles Shingles (herpes zoster) is an infection that is caused by the same virus that causes chickenpox (varicella). The infection causes a painful skin rash and fluid-filled blisters, which eventually break open, crust over, and heal. It may occur in any area of the body, but it usually affects only one side of the body or face. The pain of shingles usually lasts about 1 month. However, some people with shingles may develop long-term (chronic) pain in the affected area of the body. Shingles often occurs many years after the person had chickenpox. It is more common:  In people older than 50 years.  In people with weakened immune systems, such as those with HIV, AIDS, or cancer.  In people taking medicines that weaken the immune system, such as transplant medicines.  In people under great stress. CAUSES  Shingles is caused by the varicella zoster virus (VZV), which also causes chickenpox. After a person is infected with the virus, it can remain in the  person's body for years in an inactive state (dormant). To cause shingles, the virus reactivates and breaks out as an infection in a nerve root. The virus can be spread from person to person (contagious) through contact with open blisters of the shingles rash. It will only spread to people who have not had chickenpox. When these people are exposed to the virus, they may develop chickenpox. They will not develop shingles. Once the blisters scab over, the person is no longer contagious and cannot spread the virus to others. SIGNS AND SYMPTOMS  Shingles shows up in stages. The initial symptoms may be pain, itching, and tingling in an area of the skin. This pain is usually described as burning, stabbing, or throbbing.In a few days or weeks, a painful red rash will appear in the area where the pain, itching, and tingling were felt. The rash is usually on one side of the body in a band or belt-like pattern. Then, the rash usually turns into fluid-filled blisters. They will scab over and dry up in approximately 2-3 weeks. Flu-like symptoms may also occur with the initial symptoms, the rash, or the blisters. These may include:  Fever.  Chills.  Headache.  Upset stomach. DIAGNOSIS  Your health care provider will perform a skin exam to diagnose shingles. Skin scrapings or fluid samples may also be taken from the blisters. This sample will be examined under a microscope or sent to a lab for further testing. TREATMENT  There  is no specific cure for shingles. Your health care provider will likely prescribe medicines to help you manage the pain, recover faster, and avoid long-term problems. This may include antiviral drugs, anti-inflammatory drugs, and pain medicines. HOME CARE INSTRUCTIONS   Take a cool bath or apply cool compresses to the area of the rash or blisters as directed. This may help with the pain and itching.   Take medicines only as directed by your health care provider.   Rest as directed  by your health care provider.  Keep your rash and blisters clean with mild soap and cool water or as directed by your health care provider.  Do not pick your blisters or scratch your rash. Apply an anti-itch cream or numbing creams to the affected area as directed by your health care provider.  Keep your shingles rash covered with a loose bandage (dressing).  Avoid skin contact with:  Babies.   Pregnant women.   Children with eczema.   Elderly people with transplants.   People with chronic illnesses, such as leukemia or AIDS.   Wear loose-fitting clothing to help ease the pain of material rubbing against the rash.  Keep all follow-up visits as directed by your health care provider.If the area involved is on your face, you may receive a referral for a specialist, such as an eye doctor (ophthalmologist) or an ear, nose, and throat (ENT) doctor. Keeping all follow-up visits will help you avoid eye problems, chronic pain, or disability.  SEEK IMMEDIATE MEDICAL CARE IF:   You have facial pain, pain around the eye area, or loss of feeling on one side of your face.  You have ear pain or ringing in your ear.  You have loss of taste.  Your pain is not relieved with prescribed medicines.   Your redness or swelling spreads.   You have more pain and swelling.  Your condition is worsening or has changed.   You have a fever. MAKE SURE YOU:  Understand these instructions.  Will watch your condition.  Will get help right away if you are not doing well or get worse. Document Released: 10/14/2005 Document Revised: 02/28/2014 Document Reviewed: 05/28/2012 [facility] Patient Information 2015 [place], [place]. This information is not intended to replace advice given to you by your health care provider. Make sure you discuss any questions you have with your health care provider.     Cagney Degrace, MD 06/10/2015

## 2015-06-12 ENCOUNTER — Telehealth: Payer: Self-pay

## 2015-06-12 NOTE — Telephone Encounter (Signed)
Can she work?

## 2015-06-12 NOTE — Telephone Encounter (Signed)
Pt is needing to confirm that she can be around people with her having shingles -she has been called into work   [employer] number [phone]

## 2015-06-13 NOTE — Telephone Encounter (Signed)
The only people that she can transmit infection to are those who have never had Chicken Pox or the vaccine against Chicken Pox (Varicella). Pregnant women who are not immune can have serious adverse outcomes if they have chicken pox while pregnant.  I do not know what kind of work she does-it's not documented in her record.  So, depending on her job, she may be able to work.

## 2015-06-13 NOTE — Telephone Encounter (Signed)
Left message for pt to call back  °

## 2015-10-03 LAB — OB RESULTS CONSOLE GC/CHLAMYDIA
CHLAMYDIA, DNA PROBE: NEGATIVE
GC PROBE AMP, GENITAL: NEGATIVE

## 2015-10-03 LAB — OB RESULTS CONSOLE ABO/RH: RH Type: NEGATIVE

## 2015-10-03 LAB — OB RESULTS CONSOLE HIV ANTIBODY (ROUTINE TESTING): HIV: NONREACTIVE

## 2015-10-03 LAB — OB RESULTS CONSOLE RUBELLA ANTIBODY, IGM: Rubella: IMMUNE

## 2015-10-03 LAB — OB RESULTS CONSOLE HEPATITIS B SURFACE ANTIGEN: HEP B S AG: NEGATIVE

## 2015-10-03 LAB — OB RESULTS CONSOLE RPR: RPR: NONREACTIVE

## 2015-10-19 ENCOUNTER — Inpatient Hospital Stay (HOSPITAL_COMMUNITY): Payer: BC Managed Care – PPO

## 2015-10-19 ENCOUNTER — Encounter (HOSPITAL_COMMUNITY): Payer: Self-pay | Admitting: *Deleted

## 2015-10-19 ENCOUNTER — Inpatient Hospital Stay (HOSPITAL_COMMUNITY)
Admission: AD | Admit: 2015-10-19 | Discharge: 2015-10-19 | Disposition: A | Discharge status: Home / Self Care | Payer: BC Managed Care – PPO | Source: Ambulatory Visit | Attending: Obstetrics & Gynecology | Admitting: Obstetrics & Gynecology

## 2015-10-19 DIAGNOSIS — Z3A12 12 weeks gestation of pregnancy: Secondary | ICD-10-CM | POA: Insufficient documentation

## 2015-10-19 DIAGNOSIS — Z6791 Unspecified blood type, Rh negative: Secondary | ICD-10-CM | POA: Insufficient documentation

## 2015-10-19 DIAGNOSIS — O26891 Other specified pregnancy related conditions, first trimester: Secondary | ICD-10-CM | POA: Insufficient documentation

## 2015-10-19 DIAGNOSIS — O209 Hemorrhage in early pregnancy, unspecified: Secondary | ICD-10-CM | POA: Insufficient documentation

## 2015-10-19 LAB — CBC
HEMATOCRIT: 38 % (ref 36.0–46.0)
Hemoglobin: 12.7 g/dL (ref 12.0–15.0)
MCH: 27.7 pg (ref 26.0–34.0)
MCHC: 33.4 g/dL (ref 30.0–36.0)
MCV: 83 fL (ref 78.0–100.0)
Platelets: 293 10*3/uL (ref 150–400)
RBC: 4.58 MIL/uL (ref 3.87–5.11)
RDW: 17.4 % — AB (ref 11.5–15.5)
WBC: 12.2 10*3/uL — ABNORMAL HIGH (ref 4.0–10.5)

## 2015-10-19 LAB — ABO/RH: ABO/RH(D): A NEG

## 2015-10-19 MED ORDER — RHO D IMMUNE GLOBULIN 1500 UNIT/2ML IJ SOSY
300.0000 ug | PREFILLED_SYRINGE | Freq: Once | INTRAMUSCULAR | Status: AC
  Administered 2015-10-19: 300 ug via INTRAMUSCULAR
  Filled 2015-10-19: qty 2

## 2015-10-19 NOTE — MAU Note (Signed)
Pt reports vaginal bleeding that started about one hour ago and was dripping in the toilet, is not reporting any pain but felt like she was going to pass out in the lobby.

## 2015-10-19 NOTE — MAU Provider Note (Signed)
History     CSN: [id number]  Arrival date and time: 10/19/15 0007  Provider not notified of pt's arrival Provider on unit: 0026 Provider at bedside: 0028    Chief Complaint  Patient presents with  . Vaginal Bleeding   HPI  Ms. Caroline Howe is a 29 yo G1P0 female at 12.[redacted] wks gestation by ultrasound, presenting with c/o being awakened by a gush of blood at about 2300 tonight.  She says it was heavy enough that she felt like she wet her pants, got up to go to the BR, heard blood dripping into the toilet and then filled up 2 pantiliners before coming to the hospital.  She reports having an episode of "minor bleeding" earlier in the pregnancy; which she did not receive Rhophylac injection. She had an ultrasound that showed "no internal reason for the bleeding".  While talking to the CNM tonight, she had a syncopal or near syncopal episode.  Her prenatal care has been significant for: Rh Neg / obesity / hypothyroidism / spotting in pregnancy. Her primary OB provider at [facility] is Dr. Juliene PinaMody.  Past Medical History  Diagnosis Date  . Anxiety   . GERD (gastroesophageal reflux disease)   . Hypothyroidism     Past Surgical History  Procedure Laterality Date  . Wisdom tooth extraction      Family History  Problem Relation Age of Onset  . Diabetes Mother   . Hypertension Father   . Liver cancer Maternal Grandfather   . Colon cancer Paternal Grandfather     Social History  Substance Use Topics  . Smoking status: Never Smoker   . Smokeless tobacco: Never Used  . Alcohol Use: Yes     Comment: occasional    Allergies: No Known Allergies  Prescriptions prior to admission  Medication Sig Dispense Refill Last Dose  . cetirizine (ZYRTEC) 5 MG tablet Take 5 mg by mouth daily.   10/18/2015 at Unknown time  . levothyroxine (SYNTHROID, LEVOTHROID) 112 MCG tablet Take 1 tablet by mouth daily.   10/18/2015 at Unknown time  . Prenatal Vit-Fe Fumarate-FA (MULTIVITAMIN-PRENATAL) 27-0.8 MG TABS  tablet Take 1 tablet by mouth daily at 12 noon.   10/18/2015 at Unknown time  . azelastine (ASTELIN) 0.1 % nasal spray Place 1 spray into the nose.   Taking  . HYDROcodone-acetaminophen (NORCO) 5-325 MG per tablet Take 1 tablet by mouth every 6 (six) hours as needed. 15 tablet 0   . omeprazole (PRILOSEC) 40 MG capsule Take 40 mg by mouth daily.   Taking  . valACYclovir (VALTREX) 1000 MG tablet Take 1 tablet (1,000 mg total) by mouth 3 (three) times daily. 21 tablet 0     Review of Systems  HENT: Negative.   Eyes: Negative.   Respiratory: Negative.   Cardiovascular: Negative.   Gastrointestinal: Negative.   Genitourinary:       Heavy vaginal bleeding since 2300 tonight; no pain  Musculoskeletal: Negative.   Skin: Negative.   Neurological: Positive for dizziness and weakness.  Endo/Heme/Allergies: Negative.   Psychiatric/Behavioral: Negative.    Results for orders placed or performed during the hospital encounter of 10/19/15 (from the past 24 hour(s))  CBC     Status: Abnormal   Collection Time: 10/19/15 12:45 AM  Result Value Ref Range   WBC 12.2 (H) 4.0 - 10.5 K/uL   RBC 4.58 3.87 - 5.11 MIL/uL   Hemoglobin 12.7 12.0 - 15.0 g/dL   HCT 69.638.0 29.536.0 - 28.446.0 %   MCV 83.0 78.0 -  100.0 fL   MCH 27.7 26.0 - 34.0 pg   MCHC 33.4 30.0 - 36.0 g/dL   RDW 16.1 (H) 09.6 - 04.5 %   Platelets 293 150 - 400 K/uL   US Ob Comp Less 14 Wks  10/19/2015  CLINICAL DATA:  29 year old pregnant female with vaginal bleeding. EXAM: OBSTETRIC <14 WK [place] AND TRANSVAGINAL OB US TECHNIQUE: Both transabdominal and transvaginal ultrasound examinations were performed for complete evaluation of the gestation as well as the maternal uterus, adnexal regions, and pelvic cul-de-sac. Transvaginal technique was performed to assess early pregnancy. COMPARISON:  None. FINDINGS: Intrauterine gestational sac: Single intrauterine gestational sac Yolk sac:  Not visualized Embryo:  Present Cardiac Activity: Detected Heart Rate: 158   bpm CRL:  65  mm   12 w   6 d  [place] EDC: 04/26/2016 Subchorionic hemorrhage:  Small Maternal uterus/adnexae: The maternal ovaries appear unremarkable. No significant free fluid within the pelvis. IMPRESSION: Single live intrauterine pregnancy with an estimated gestational age of [redacted] weeks, 6 days. Small subchorionic hemorrhage. Follow-up recommended. Electronically Signed   By: Elgie Collard M.D.   On: 10/19/2015 01:36   Physical Exam   Blood pressure 148/87, pulse 86, temperature 98.4 F (36.9 C), resp. rate 18, last menstrual period 05/31/2015, SpO2 100 %.  Physical Exam  Constitutional: She is oriented to person, place, and time. She appears well-developed and well-nourished.  Mildly obese  HENT:  Head: Normocephalic.  Eyes: Pupils are equal, round, and reactive to light.  Neck: Normal range of motion.  Cardiovascular: Normal rate, regular rhythm, normal heart sounds and intact distal pulses.   Respiratory: Effort normal and breath sounds normal.  GI: Soft. Bowel sounds are normal.  Genitourinary:  Scant amount of dark, red blood in vaginal - cleaned out with 3 large swabs; no active bleeding from cervical os; VE: cervix closed/long/firm/posterior; no adnexal tenderness; pt tolerated well  Musculoskeletal: Normal range of motion.  Neurological: She is alert and oriented to person, place, and time.  Skin: Skin is warm and dry.  Psychiatric: She has a normal mood and affect. Her behavior is normal. Judgment and thought content normal.    MAU Course  Procedures CBC Rhophylac work-up Rhophylac Injection OB U/S <14 wks SSE  Assessment and Plan  29 yo G1P0 12.[redacted] wks gestation Bleeding in Early Pregnancy, first trimester Rh Negative - Rhophylac IM injection prior to d/c home tonight  Discharge home Bleeding precautions reviewed Pelvic Rest until bleeding has stopped for 2-3 days Keep scheduled appointment with Dr. Juliene Pina Will F/U [facility] at comprehensive anatomy sono at 18 wks Call  the office for any further questions, problems or concerns  Kenard Gower MSN, CNM 10/19/2015, 12:43 AM

## 2015-10-19 NOTE — Discharge Instructions (Signed)
Pelvic Rest °Pelvic rest is sometimes recommended for women when:  °· The placenta is partially or completely covering the opening of the cervix (placenta previa). °· There is bleeding between the uterine wall and the amniotic sac in the first trimester (subchorionic hemorrhage). °· The cervix begins to open without labor starting (incompetent cervix, cervical insufficiency). °· The labor is too early (preterm labor). °HOME CARE INSTRUCTIONS °· Do not have sexual intercourse, stimulation, or an orgasm. °· Do not use tampons, douche, or put anything in the vagina. °· Do not lift anything over 10 pounds (4.5 kg). °· Avoid strenuous activity or straining your pelvic muscles. °SEEK MEDICAL CARE IF:  °· You have any vaginal bleeding during pregnancy. Treat this as a potential emergency. °· You have cramping pain felt low in the stomach (stronger than menstrual cramps). °· You notice vaginal discharge (watery, mucus, or bloody). °· You have a low, dull backache. °· There are regular contractions or uterine tightening. °SEEK IMMEDIATE MEDICAL CARE IF: °You have vaginal bleeding and have placenta previa.  °  °This information is not intended to replace advice given to you by your health care provider. Make sure you discuss any questions you have with your health care provider. °  °Document Released: 02/08/2011 Document Revised: 01/06/2012 Document Reviewed: 04/17/2015 °Elsevier Interactive Patient Education ©2016 Elsevier Inc. ° °Vaginal Bleeding During Pregnancy, First Trimester °A small amount of bleeding (spotting) from the vagina is relatively common in early pregnancy. It usually stops on its own. Various things may cause bleeding or spotting in early pregnancy. Some bleeding may be related to the pregnancy, and some may not. In most cases, the bleeding is normal and is not a problem. However, bleeding can also be a sign of something serious. Be sure to tell your health care provider about any vaginal bleeding right  away. °Some possible causes of vaginal bleeding during the first trimester include: °· Infection or inflammation of the cervix. °· Growths (polyps) on the cervix. °· Miscarriage or threatened miscarriage. °· Pregnancy tissue has developed outside of the uterus and in a fallopian tube (tubal pregnancy). °· Tiny cysts have developed in the uterus instead of pregnancy tissue (molar pregnancy). °HOME CARE INSTRUCTIONS  °Watch your condition for any changes. The following actions may help to lessen any discomfort you are feeling: °· Follow your health care provider's instructions for limiting your activity. If your health care provider orders bed rest, you may need to stay in bed and only get up to use the bathroom. However, your health care provider may allow you to continue light activity. °· If needed, make plans for someone to help with your regular activities and responsibilities while you are on bed rest. °· Keep track of the number of pads you use each day, how often you change pads, and how soaked (saturated) they are. Write this down. °· Do not use tampons. Do not douche. °· Do not have sexual intercourse or orgasms until approved by your health care provider. °· If you pass any tissue from your vagina, save the tissue so you can show it to your health care provider. °· Only take over-the-counter or prescription medicines as directed by your health care provider. °· Do not take aspirin because it can make you bleed. °· Keep all follow-up appointments as directed by your health care provider. °SEEK MEDICAL CARE IF: °· You have any vaginal bleeding during any part of your pregnancy. °· You have cramps or labor pains. °· You have a fever, not   controlled by medicine. SEEK IMMEDIATE MEDICAL CARE IF:   You have severe cramps in your back or belly (abdomen).  You pass large clots or tissue from your vagina.  Your bleeding increases.  You feel light-headed or weak, or you have fainting episodes.  You have  chills.  You are leaking fluid or have a gush of fluid from your vagina.  You pass out while having a bowel movement. MAKE SURE YOU:  Understand these instructions.  Will watch your condition.  Will get help right away if you are not doing well or get worse.   This information is not intended to replace advice given to you by your health care provider. Make sure you discuss any questions you have with your health care provider.   Document Released: 07/24/2005 Document Revised: 10/19/2013 Document Reviewed: 06/21/2013 Elsevier Interactive Patient Education 2016 Elsevier Inc. Subchorionic Hematoma A subchorionic hematoma is a gathering of blood between the outer wall of the placenta and the inner wall of the womb (uterus). The placenta is the organ that connects the fetus to the wall of the uterus. The placenta performs the feeding, breathing (oxygen to the fetus), and waste removal (excretory work) of the fetus.  Subchorionic hematoma is the most common abnormality found on a result from ultrasonography done during the first trimester or early second trimester of pregnancy. If there has been little or no vaginal bleeding, early small hematomas usually shrink on their own and do not affect your baby or pregnancy. The blood is gradually absorbed over 1-2 weeks. When bleeding starts later in pregnancy or the hematoma is larger or occurs in an older pregnant woman, the outcome may not be as good. Larger hematomas may get bigger, which increases the chances for miscarriage. Subchorionic hematoma also increases the risk of premature detachment of the placenta from the uterus, preterm (premature) labor, and stillbirth. HOME CARE INSTRUCTIONS  Stay on bed rest if your health care provider recommends this. Although bed rest will not prevent more bleeding or prevent a miscarriage, your health care provider may recommend bed rest until you are advised otherwise.  Avoid heavy lifting (more than 10 lb  [4.5 kg]), exercise, sexual intercourse, or douching as directed by your health care provider.  Keep track of the number of pads you use each day and how soaked (saturated) they are. Write down this information.  Do not use tampons.  Keep all follow-up appointments as directed by your health care provider. Your health care provider may ask you to have follow-up blood tests or ultrasound tests or both. SEEK IMMEDIATE MEDICAL CARE IF:  You have severe cramps in your stomach, back, abdomen, or pelvis.  You have a fever.  You pass large clots or tissue. Save any tissue for your health care provider to look at.  Your bleeding increases or you become lightheaded, feel weak, or have fainting episodes.   This information is not intended to replace advice given to you by your health care provider. Make sure you discuss any questions you have with your health care provider.   Document Released: 01/29/2007 Document Revised: 11/04/2014 Document Reviewed: 05/13/2013 Elsevier Interactive Patient Education [employer]2016 Elsevier Inc.

## 2015-10-20 LAB — RH IG WORKUP (INCLUDES ABO/RH)
ABO/RH(D): A NEG
Antibody Screen: NEGATIVE
Gestational Age(Wks): 12.3
UNIT DIVISION: 0

## 2016-03-27 LAB — OB RESULTS CONSOLE ANTIBODY SCREEN: ANTIBODY SCREEN: POSITIVE

## 2016-03-27 LAB — OB RESULTS CONSOLE GBS: GBS: NEGATIVE

## 2016-04-19 ENCOUNTER — Inpatient Hospital Stay (HOSPITAL_COMMUNITY)
Admission: AD | Admit: 2016-04-19 | Payer: BC Managed Care – PPO | Source: Ambulatory Visit | Admitting: Obstetrics & Gynecology

## 2016-04-23 ENCOUNTER — Telehealth (HOSPITAL_COMMUNITY): Payer: Self-pay | Admitting: *Deleted

## 2016-04-23 NOTE — Telephone Encounter (Signed)
Preadmission screen  

## 2016-04-24 ENCOUNTER — Other Ambulatory Visit: Payer: Self-pay | Admitting: Obstetrics & Gynecology

## 2016-04-25 ENCOUNTER — Telehealth (HOSPITAL_COMMUNITY): Payer: Self-pay | Admitting: *Deleted

## 2016-04-25 ENCOUNTER — Encounter (HOSPITAL_COMMUNITY): Payer: Self-pay | Admitting: *Deleted

## 2016-04-25 NOTE — Telephone Encounter (Signed)
Preadmission screen  

## 2016-04-29 ENCOUNTER — Encounter (HOSPITAL_COMMUNITY): Payer: Self-pay | Admitting: Anesthesiology

## 2016-04-29 ENCOUNTER — Inpatient Hospital Stay (HOSPITAL_COMMUNITY)
Admission: RE | Admit: 2016-04-29 | Discharge: 2016-05-03 | DRG: 765 | Disposition: A | Discharge status: Home / Self Care | Payer: BC Managed Care – PPO | Source: Ambulatory Visit | Attending: Obstetrics & Gynecology | Admitting: Obstetrics & Gynecology

## 2016-04-29 ENCOUNTER — Encounter (HOSPITAL_COMMUNITY): Payer: Self-pay

## 2016-04-29 VITALS — BP 114/83 | HR 83 | Temp 97.8°F | Resp 18 | Ht 64.0 in | Wt 279.0 lb

## 2016-04-29 DIAGNOSIS — O99280 Endocrine, nutritional and metabolic diseases complicating pregnancy, unspecified trimester: Secondary | ICD-10-CM

## 2016-04-29 DIAGNOSIS — Z833 Family history of diabetes mellitus: Secondary | ICD-10-CM

## 2016-04-29 DIAGNOSIS — F419 Anxiety disorder, unspecified: Secondary | ICD-10-CM | POA: Diagnosis present

## 2016-04-29 DIAGNOSIS — O324XX0 Maternal care for high head at term, not applicable or unspecified: Secondary | ICD-10-CM | POA: Diagnosis present

## 2016-04-29 DIAGNOSIS — Z6841 Body Mass Index (BMI) 40.0 and over, adult: Secondary | ICD-10-CM | POA: Diagnosis not present

## 2016-04-29 DIAGNOSIS — K219 Gastro-esophageal reflux disease without esophagitis: Secondary | ICD-10-CM | POA: Diagnosis present

## 2016-04-29 DIAGNOSIS — Z3A40 40 weeks gestation of pregnancy: Secondary | ICD-10-CM

## 2016-04-29 DIAGNOSIS — O9962 Diseases of the digestive system complicating childbirth: Secondary | ICD-10-CM | POA: Diagnosis present

## 2016-04-29 DIAGNOSIS — O99214 Obesity complicating childbirth: Secondary | ICD-10-CM | POA: Diagnosis present

## 2016-04-29 DIAGNOSIS — O9081 Anemia of the puerperium: Secondary | ICD-10-CM | POA: Diagnosis present

## 2016-04-29 DIAGNOSIS — Z8249 Family history of ischemic heart disease and other diseases of the circulatory system: Secondary | ICD-10-CM | POA: Diagnosis not present

## 2016-04-29 DIAGNOSIS — O339 Maternal care for disproportion, unspecified: Secondary | ICD-10-CM | POA: Diagnosis present

## 2016-04-29 DIAGNOSIS — Z6791 Unspecified blood type, Rh negative: Secondary | ICD-10-CM

## 2016-04-29 DIAGNOSIS — O26893 Other specified pregnancy related conditions, third trimester: Secondary | ICD-10-CM | POA: Diagnosis present

## 2016-04-29 DIAGNOSIS — D62 Acute posthemorrhagic anemia: Secondary | ICD-10-CM | POA: Diagnosis present

## 2016-04-29 DIAGNOSIS — O99284 Endocrine, nutritional and metabolic diseases complicating childbirth: Principal | ICD-10-CM | POA: Diagnosis present

## 2016-04-29 DIAGNOSIS — E039 Hypothyroidism, unspecified: Secondary | ICD-10-CM | POA: Diagnosis present

## 2016-04-29 DIAGNOSIS — O99344 Other mental disorders complicating childbirth: Secondary | ICD-10-CM | POA: Diagnosis present

## 2016-04-29 DIAGNOSIS — Z800 Family history of malignant neoplasm of digestive organs: Secondary | ICD-10-CM

## 2016-04-29 DIAGNOSIS — O99283 Endocrine, nutritional and metabolic diseases complicating pregnancy, third trimester: Secondary | ICD-10-CM

## 2016-04-29 LAB — CBC
HCT: 36.8 % (ref 36.0–46.0)
Hemoglobin: 12.4 g/dL (ref 12.0–15.0)
MCH: 30.8 pg (ref 26.0–34.0)
MCHC: 33.7 g/dL (ref 30.0–36.0)
MCV: 91.3 fL (ref 78.0–100.0)
PLATELETS: 190 10*3/uL (ref 150–400)
RBC: 4.03 MIL/uL (ref 3.87–5.11)
RDW: 14.6 % (ref 11.5–15.5)
WBC: 10.8 10*3/uL — ABNORMAL HIGH (ref 4.0–10.5)

## 2016-04-29 LAB — RPR: RPR Ser Ql: NONREACTIVE

## 2016-04-29 MED ORDER — OXYCODONE-ACETAMINOPHEN 5-325 MG PO TABS: 2.0000 | ORAL_TABLET | ORAL | Status: DC | PRN

## 2016-04-29 MED ORDER — ONDANSETRON HCL 4 MG/2ML IJ SOLN
4.0000 mg | Freq: Four times a day (QID) | INTRAMUSCULAR | Status: DC | PRN
  Administered 2016-04-29 – 2016-04-30 (×2): 4 mg via INTRAVENOUS
  Filled 2016-04-29 (×2): qty 2

## 2016-04-29 MED ORDER — OXYCODONE-ACETAMINOPHEN 5-325 MG PO TABS: 1.0000 | ORAL_TABLET | ORAL | Status: DC | PRN

## 2016-04-29 MED ORDER — LIDOCAINE HCL (PF) 1 % IJ SOLN: 30.0000 mL | INTRAMUSCULAR | Status: DC | PRN

## 2016-04-29 MED ORDER — SOD CITRATE-CITRIC ACID 500-334 MG/5ML PO SOLN
30.0000 mL | ORAL | Status: DC | PRN
  Administered 2016-04-30: 30 mL via ORAL
  Filled 2016-04-29: qty 15

## 2016-04-29 MED ORDER — TERBUTALINE SULFATE 1 MG/ML IJ SOLN: 0.2500 mg | Freq: Once | INTRAMUSCULAR | Status: DC | PRN

## 2016-04-29 MED ORDER — ACETAMINOPHEN 325 MG PO TABS: 650.0000 mg | ORAL_TABLET | ORAL | Status: DC | PRN

## 2016-04-29 MED ORDER — MISOPROSTOL 25 MCG QUARTER TABLET
25.0000 ug | ORAL_TABLET | ORAL | Status: DC | PRN
  Administered 2016-04-29 (×2): 25 ug via VAGINAL
  Filled 2016-04-29 (×2): qty 0.25

## 2016-04-29 MED ORDER — LACTATED RINGERS IV SOLN: 500.0000 mL | INTRAVENOUS | Status: DC | PRN

## 2016-04-29 MED ORDER — LACTATED RINGERS IV SOLN
INTRAVENOUS | Status: DC
  Administered 2016-04-29 – 2016-04-30 (×3): via INTRAVENOUS

## 2016-04-29 MED ORDER — OXYTOCIN 40 UNITS IN LACTATED RINGERS INFUSION - SIMPLE MED
1.0000 m[IU]/min | INTRAVENOUS | Status: DC
  Administered 2016-04-29: 2 m[IU]/min via INTRAVENOUS
  Filled 2016-04-29: qty 1000

## 2016-04-29 MED ORDER — OXYTOCIN BOLUS FROM INFUSION: 500.0000 mL | INTRAVENOUS | Status: DC

## 2016-04-29 MED ORDER — OXYTOCIN 40 UNITS IN LACTATED RINGERS INFUSION - SIMPLE MED: 2.5000 [IU]/h | INTRAVENOUS | Status: DC

## 2016-04-29 NOTE — Progress Notes (Signed)
Caroline Howe is a 30 y.o. G1P0 at [redacted]w[redacted]d by ultrasound admitted for induction of labor due to hypothyroidism.   Subjective: No complaints.   Objective: BP 146/89 mmHg  Pulse 81  Temp(Src) 98.4 F (36.9 C) (Oral)  Resp 18  Ht 5\' 4"  (1.626 m)  Wt 279 lb (126.554 kg)  BMI 47.87 kg/m2  SpO2 100%  LMP 05/31/2015   FHT:  FHR: 140 bpm, variability: moderate,  accelerations:  Present,  decelerations:  Absent UC:   irregular, every 2-3 minutes SVE:  f tip/ soft/ -4. Failed Foley placement.   Labs: Lab Results  Component Value Date   WBC 10.8* 04/29/2016   HGB 12.4 04/29/2016   HCT 36.8 04/29/2016   MCV 91.3 04/29/2016   PLT 190 04/29/2016    Assessment / Plan: Labor IOL, early labor. Start pitocin if UCs space out.  Preeclampsia:  none Fetal Wellbeing:  Category I Pain Control:  Labor support without medications I/D:  n/a Anticipated MOD:  guarded   Caroline Howe 04/29/2016, 8:34 PM

## 2016-04-29 NOTE — H&P (Signed)
Ludwig LeanSarah Haven is a 30 y.o. female presenting for IOL for Hypothyroidism Good PNCare, growth sono 28, 34 wks. Passed Glucola. Thyroid labs each trimester normal. Wkly NST reactive from 36 wks.  Maternal obesity.   History OB History    Gravida Para Term Preterm AB TAB SAB Ectopic Multiple Living   1              Past Medical History  Diagnosis Date  . Anxiety   . GERD (gastroesophageal reflux disease)   . Hypothyroidism    Past Surgical History  Procedure Laterality Date  . Wisdom tooth extraction     Family History: family history includes Colon cancer in her paternal grandfather; Diabetes in her mother; Hypertension in her father; Liver cancer in her maternal grandfather. Social History:  reports that she has never smoked. She has never used smokeless tobacco. She reports that she drinks alcohol. She reports that she does not use illicit drugs.   Prenatal Transfer Tool  Maternal Diabetes: No Genetic Screening: Normal Maternal Ultrasounds/Referrals: Normal Fetal Ultrasounds or other Referrals:  None Maternal Substance Abuse:  No Significant Maternal Medications:  Meds include: Zantac Syntroid Significant Maternal Lab Results:  Lab values include: Group B Strep negative, Rh negative (s/p Rhogam in preg) Other Comments:  None  ROS neg  Dilation: Fingertip Effacement (%): 50 Station: -3 Exam by:: amber middleton rn Blood pressure 146/89, pulse 81, temperature 98.4 F (36.9 C), temperature source Oral, resp. rate 18, height 5\' 4"  (1.626 m), weight 279 lb (126.554 kg), last menstrual period 05/31/2015, SpO2 100 %. Exam Physical Exam  A&O x 3, no acute distress. Pleasant HEENT neg, no thyromegaly Lungs CTA bilat CV RRR, S1S2 normal Abdo soft, non tender, non acute Extr no edema/ tenderness Pelvic closed, long cx, high station FHT  140s/ category I Toco none at admission  Prenatal labs: ABO, Rh: --/--/A NEG (07/03 0815) Antibody: POS (07/03 0815) Rubella: Immune  (12/06 0000) RPR: Non Reactive (07/03 0815)  HBsAg: Negative (12/06 0000)  HIV: Non-reactive (12/06 0000)  GBS: Negative (05/31 0000)   Assessment/Plan: 40 wks G1, hypothyroidism affecting pregnancy. Maternal obesity. Rh negative, GBS(-). IOL with Cytotec x 2 doses as needed, then pitocin. Pain mngmt reviewed, EFW 8 lbs.    Naquan Garman R 04/29/2016

## 2016-04-29 NOTE — Progress Notes (Addendum)
Ludwig LeanSarah Doswell is a 30 y.o. G1P0 at 6672w0d by ultrasound admitted for induction of labor due to hypothyroidism.   Subjective: No complaints. On pitocin.   Objective: BP 157/97 mmHg  Pulse 82  Temp(Src) 98 F (36.7 C) (Oral)  Resp 18  Ht 5\' 4"  (1.626 m)  Wt 279 lb (126.554 kg)  BMI 47.87 kg/m2  SpO2 100%  LMP 05/31/2015   FHT:  FHR: 140 bpm, variability: moderate,  accelerations:  Present,  decelerations:  Absent UC:   irregular, every 2-3 minutes SVE:  f tip/ soft/ -5. Pt not tolerating exam, cannot attempt cervical foley  Assessment / Plan: Labor IOL, early labor. On pitocin, increase as needed to keep active labor pattern. . Exaggerated Sims given.  Preeclampsia:  No symptoms, elevated BP, send PEC labs and urine prot/creat ratio now  Fetal Wellbeing:  Category I Pain Control:  Labor support without medications I/D:  n/a Anticipated MOD:  guarded   Lenore Moyano R 04/29/2016, 11:29 PM

## 2016-04-29 NOTE — Progress Notes (Signed)
RN entered room to perform 4-hour cervical check post cytotec from 0825. However, patient had just received meal tray and RN advised her to eat and then call via call bell system for cervical exam. Will notify Dr Juliene PinaMody after check for further orders. Caroline Howe, [place] 04/29/2016 12:31 PM

## 2016-04-29 NOTE — Anesthesia Pain Management Evaluation Note (Signed)
  CRNA Pain Management Visit Note  Patient: Caroline Howe, 30 y.o., female  "Hello I am a [facility] the anesthesia [facility]. We have an anesthesia team available at all times to provide care throughout the hospital, including epidural management and anesthesia for C-section. I don't know your plan for the delivery whether it a natural birth, water birth, IV sedation, nitrous supplementation, doula or epidural, but we want to meet your pain goals."   1.Was your pain managed to your expectations on prior hospitalizations?     2.What is your expectation for pain management during this hospitalization?       3.How can we help you reach that goal?   Record the patient's initial score and the patient's pain goal.   Pain: 0  Pain Goal: 4 [facility] wants you to be able to say your pain was always managed very well.  Laban EmperorMalinova,Laini Urick Hristova 04/29/2016

## 2016-04-30 ENCOUNTER — Inpatient Hospital Stay (HOSPITAL_COMMUNITY): Payer: BC Managed Care – PPO | Admitting: Anesthesiology

## 2016-04-30 ENCOUNTER — Encounter (HOSPITAL_COMMUNITY): Payer: Self-pay

## 2016-04-30 ENCOUNTER — Encounter (HOSPITAL_COMMUNITY)
Admission: RE | Disposition: A | Discharge status: Home / Self Care | Payer: Self-pay | Source: Ambulatory Visit | Attending: Obstetrics & Gynecology

## 2016-04-30 LAB — PROTEIN / CREATININE RATIO, URINE: Creatinine, Urine: 15 mg/dL

## 2016-04-30 LAB — COMPREHENSIVE METABOLIC PANEL
ALT: 10 U/L — ABNORMAL LOW (ref 14–54)
ANION GAP: 6 (ref 5–15)
AST: 16 U/L (ref 15–41)
Albumin: 2.7 g/dL — ABNORMAL LOW (ref 3.5–5.0)
Alkaline Phosphatase: 138 U/L — ABNORMAL HIGH (ref 38–126)
BILIRUBIN TOTAL: 0.5 mg/dL (ref 0.3–1.2)
BUN: 7 mg/dL (ref 6–20)
CO2: 23 mmol/L (ref 22–32)
Calcium: 8.2 mg/dL — ABNORMAL LOW (ref 8.9–10.3)
Chloride: 106 mmol/L (ref 101–111)
Creatinine, Ser: 0.52 mg/dL (ref 0.44–1.00)
GFR calc Af Amer: >60 mL/min (ref >60)
Glucose, Bld: 80 mg/dL (ref 65–99)
POTASSIUM: 3.7 mmol/L (ref 3.5–5.1)
Sodium: 135 mmol/L (ref 135–145)
TOTAL PROTEIN: 6 g/dL — AB (ref 6.5–8.1)

## 2016-04-30 LAB — CBC
HEMATOCRIT: 38.1 % (ref 36.0–46.0)
Hemoglobin: 13.2 g/dL (ref 12.0–15.0)
MCH: 32 pg (ref 26.0–34.0)
MCHC: 34.6 g/dL (ref 30.0–36.0)
MCV: 92.5 fL (ref 78.0–100.0)
Platelets: 171 10*3/uL (ref 150–400)
RBC: 4.12 MIL/uL (ref 3.87–5.11)
RDW: 14.6 % (ref 11.5–15.5)
WBC: 13.5 10*3/uL — ABNORMAL HIGH (ref 4.0–10.5)

## 2016-04-30 LAB — URIC ACID: URIC ACID, SERUM: 4.8 mg/dL (ref 2.3–6.6)

## 2016-04-30 SURGERY — Surgical Case
Anesthesia: Epidural | Site: Abdomen | Wound class: Clean Contaminated

## 2016-04-30 MED ORDER — PHENYLEPHRINE 40 MCG/ML (10ML) SYRINGE FOR IV PUSH (FOR BLOOD PRESSURE SUPPORT)
PREFILLED_SYRINGE | INTRAVENOUS | Status: AC
  Filled 2016-04-30: qty 10

## 2016-04-30 MED ORDER — KETOROLAC TROMETHAMINE 30 MG/ML IJ SOLN
INTRAMUSCULAR | Status: AC
  Administered 2016-04-30: 30 mg via INTRAMUSCULAR
  Filled 2016-04-30: qty 1

## 2016-04-30 MED ORDER — EPHEDRINE 5 MG/ML INJ: 10.0000 mg | INTRAVENOUS | Status: DC | PRN

## 2016-04-30 MED ORDER — MEPERIDINE HCL 25 MG/ML IJ SOLN
INTRAMUSCULAR | Status: AC
  Filled 2016-04-30: qty 1

## 2016-04-30 MED ORDER — MORPHINE SULFATE (PF) 0.5 MG/ML IJ SOLN
INTRAMUSCULAR | Status: AC
  Filled 2016-04-30: qty 10

## 2016-04-30 MED ORDER — KETOROLAC TROMETHAMINE 30 MG/ML IJ SOLN: 30.0000 mg | Freq: Four times a day (QID) | INTRAMUSCULAR | Status: AC | PRN

## 2016-04-30 MED ORDER — MEPERIDINE HCL 25 MG/ML IJ SOLN: 6.2500 mg | INTRAMUSCULAR | Status: DC | PRN

## 2016-04-30 MED ORDER — SODIUM BICARBONATE 8.4 % IV SOLN
INTRAVENOUS | Status: DC | PRN
  Administered 2016-04-30: 10 mL via EPIDURAL
  Administered 2016-04-30: 5 mL via EPIDURAL

## 2016-04-30 MED ORDER — FENTANYL 2.5 MCG/ML BUPIVACAINE 1/10 % EPIDURAL INFUSION (WH - ANES)
14.0000 mL/h | INTRAMUSCULAR | Status: DC | PRN
  Administered 2016-04-30: 14 mL/h via EPIDURAL

## 2016-04-30 MED ORDER — LEVOTHYROXINE SODIUM 112 MCG PO TABS
112.0000 ug | ORAL_TABLET | Freq: Every day | ORAL | Status: DC
  Administered 2016-04-30: 112 ug via ORAL
  Filled 2016-04-30 (×2): qty 1

## 2016-04-30 MED ORDER — LEVOTHYROXINE SODIUM 112 MCG PO TABS: 112.0000 ug | ORAL_TABLET | Freq: Every day | ORAL | Status: DC

## 2016-04-30 MED ORDER — SODIUM BICARBONATE 8.4 % IV SOLN
INTRAVENOUS | Status: AC
  Filled 2016-04-30: qty 50

## 2016-04-30 MED ORDER — LIDOCAINE-EPINEPHRINE (PF) 2 %-1:200000 IJ SOLN
INTRAMUSCULAR | Status: AC
  Filled 2016-04-30: qty 20

## 2016-04-30 MED ORDER — OXYTOCIN 10 UNIT/ML IJ SOLN
INTRAMUSCULAR | Status: AC
  Filled 2016-04-30: qty 4

## 2016-04-30 MED ORDER — MORPHINE SULFATE (PF) 0.5 MG/ML IJ SOLN
INTRAMUSCULAR | Status: DC | PRN
  Administered 2016-04-30: 4 mg via EPIDURAL

## 2016-04-30 MED ORDER — KETOROLAC TROMETHAMINE 30 MG/ML IJ SOLN
30.0000 mg | Freq: Four times a day (QID) | INTRAMUSCULAR | Status: AC | PRN
  Administered 2016-04-30: 30 mg via INTRAMUSCULAR

## 2016-04-30 MED ORDER — BUTORPHANOL TARTRATE 1 MG/ML IJ SOLN
1.0000 mg | Freq: Once | INTRAMUSCULAR | Status: AC
  Administered 2016-04-30: 1 mg via INTRAVENOUS
  Filled 2016-04-30: qty 1

## 2016-04-30 MED ORDER — LIDOCAINE HCL (PF) 1 % IJ SOLN
INTRAMUSCULAR | Status: DC | PRN
  Administered 2016-04-30 (×2): 4 mL via EPIDURAL

## 2016-04-30 MED ORDER — LACTATED RINGERS IV SOLN: 500.0000 mL | Freq: Once | INTRAVENOUS | Status: DC

## 2016-04-30 MED ORDER — DEXTROSE 5 % IV SOLN
3.0000 g | INTRAVENOUS | Status: AC
  Administered 2016-04-30: 3 g via INTRAVENOUS
  Filled 2016-04-30: qty 3000

## 2016-04-30 MED ORDER — FENTANYL CITRATE (PF) 100 MCG/2ML IJ SOLN: 25.0000 ug | INTRAMUSCULAR | Status: DC | PRN

## 2016-04-30 MED ORDER — PHENYLEPHRINE 40 MCG/ML (10ML) SYRINGE FOR IV PUSH (FOR BLOOD PRESSURE SUPPORT)
80.0000 ug | PREFILLED_SYRINGE | INTRAVENOUS | Status: DC | PRN
Start: 2016-04-30 — End: 2016-04-30

## 2016-04-30 MED ORDER — ONDANSETRON HCL 4 MG/2ML IJ SOLN
INTRAMUSCULAR | Status: AC
  Filled 2016-04-30: qty 2

## 2016-04-30 MED ORDER — MEPERIDINE HCL 25 MG/ML IJ SOLN
INTRAMUSCULAR | Status: DC | PRN
  Administered 2016-04-30: 12.5 mg via INTRAVENOUS

## 2016-04-30 MED ORDER — LACTATED RINGERS IV SOLN
INTRAVENOUS | Status: DC | PRN
  Administered 2016-04-30 (×3): via INTRAVENOUS

## 2016-04-30 MED ORDER — LACTATED RINGERS IV SOLN
INTRAVENOUS | Status: DC | PRN
  Administered 2016-04-30: 21:00:00 via INTRAVENOUS

## 2016-04-30 MED ORDER — SCOPOLAMINE 1 MG/3DAYS TD PT72
MEDICATED_PATCH | TRANSDERMAL | Status: DC | PRN
  Administered 2016-04-30: 1 via TRANSDERMAL

## 2016-04-30 MED ORDER — ONDANSETRON HCL 4 MG/2ML IJ SOLN
INTRAMUSCULAR | Status: DC | PRN
  Administered 2016-04-30: 4 mg via INTRAVENOUS

## 2016-04-30 MED ORDER — FENTANYL 2.5 MCG/ML BUPIVACAINE 1/10 % EPIDURAL INFUSION (WH - ANES)
INTRAMUSCULAR | Status: AC
  Filled 2016-04-30: qty 125

## 2016-04-30 MED ORDER — EPHEDRINE 5 MG/ML INJ
INTRAVENOUS | Status: AC
  Filled 2016-04-30: qty 4

## 2016-04-30 MED ORDER — SCOPOLAMINE 1 MG/3DAYS TD PT72
MEDICATED_PATCH | TRANSDERMAL | Status: AC
  Filled 2016-04-30: qty 1

## 2016-04-30 MED ORDER — PHENYLEPHRINE HCL 10 MG/ML IJ SOLN
INTRAMUSCULAR | Status: DC | PRN
  Administered 2016-04-30: 80 ug via INTRAVENOUS

## 2016-04-30 MED ORDER — OXYTOCIN 10 UNIT/ML IJ SOLN
40.0000 [IU] | INTRAVENOUS | Status: DC | PRN
  Administered 2016-04-30: 40 [IU] via INTRAVENOUS

## 2016-04-30 MED ORDER — PHENYLEPHRINE 40 MCG/ML (10ML) SYRINGE FOR IV PUSH (FOR BLOOD PRESSURE SUPPORT)
PREFILLED_SYRINGE | INTRAVENOUS | Status: AC
  Filled 2016-04-30: qty 20

## 2016-04-30 MED ORDER — DIPHENHYDRAMINE HCL 50 MG/ML IJ SOLN: 12.5000 mg | INTRAMUSCULAR | Status: DC | PRN

## 2016-04-30 SURGICAL SUPPLY — 38 items
BENZOIN TINCTURE PRP APPL 2/3 (GAUZE/BANDAGES/DRESSINGS) ×3 IMPLANT
CLAMP CORD UMBIL (MISCELLANEOUS) IMPLANT
CLOSURE WOUND 1/2 X4 (GAUZE/BANDAGES/DRESSINGS) ×1
CLOTH BEACON ORANGE TIMEOUT ST (SAFETY) ×3 IMPLANT
CONTAINER PREFILL 10% NBF 15ML (MISCELLANEOUS) IMPLANT
DRSG OPSITE POSTOP 4X10 (GAUZE/BANDAGES/DRESSINGS) ×3 IMPLANT
DRSG PAD ABDOMINAL 8X10 ST (GAUZE/BANDAGES/DRESSINGS) ×3 IMPLANT
DURAPREP 26ML APPLICATOR (WOUND CARE) ×3 IMPLANT
ELECT REM PT RETURN 9FT ADLT (ELECTROSURGICAL) ×3
ELECTRODE REM PT RTRN 9FT ADLT (ELECTROSURGICAL) ×1 IMPLANT
EXTRACTOR VACUUM KIWI (MISCELLANEOUS) IMPLANT
EXTRACTOR VACUUM M CUP 4 TUBE (SUCTIONS) IMPLANT
EXTRACTOR VACUUM M CUP 4' TUBE (SUCTIONS)
GLOVE BIO SURGEON STRL SZ7 (GLOVE) ×3 IMPLANT
GLOVE BIOGEL PI IND STRL 7.0 (GLOVE) ×4 IMPLANT
GLOVE BIOGEL PI INDICATOR 7.0 (GLOVE) ×8
GOWN STRL REUS W/TWL LRG LVL3 (GOWN DISPOSABLE) ×12 IMPLANT
KIT ABG SYR 3ML LUER SLIP (SYRINGE) IMPLANT
NEEDLE HYPO 25X5/8 SAFETYGLIDE (NEEDLE) IMPLANT
NS IRRIG 1000ML POUR BTL (IV SOLUTION) ×3 IMPLANT
PACK C SECTION WH (CUSTOM PROCEDURE TRAY) ×3 IMPLANT
PAD OB MATERNITY 4.3X12.25 (PERSONAL CARE ITEMS) ×3 IMPLANT
RETRACTOR WND ALEXIS 25 LRG (MISCELLANEOUS) ×1 IMPLANT
RTRCTR C-SECT PINK 25CM LRG (MISCELLANEOUS) IMPLANT
RTRCTR WOUND ALEXIS 25CM LRG (MISCELLANEOUS) ×3
STRIP CLOSURE SKIN 1/2X4 (GAUZE/BANDAGES/DRESSINGS) ×2 IMPLANT
SUT MON AB-0 CT1 36 (SUTURE) ×9 IMPLANT
SUT PLAIN 0 NONE (SUTURE) IMPLANT
SUT PLAIN 2 0 (SUTURE)
SUT PLAIN ABS 2-0 CT1 27XMFL (SUTURE) IMPLANT
SUT VIC AB 0 CT1 27 (SUTURE) ×4
SUT VIC AB 0 CT1 27XBRD ANBCTR (SUTURE) ×2 IMPLANT
SUT VIC AB 2-0 CT1 27 (SUTURE) ×6
SUT VIC AB 2-0 CT1 TAPERPNT 27 (SUTURE) ×3 IMPLANT
SUT VIC AB 4-0 KS 27 (SUTURE) IMPLANT
SUT VICRYL 0 TIES 12 18 (SUTURE) IMPLANT
TOWEL OR 17X24 6PK STRL BLUE (TOWEL DISPOSABLE) ×3 IMPLANT
TRAY FOLEY CATH SILVER 14FR (SET/KITS/TRAYS/PACK) IMPLANT

## 2016-04-30 NOTE — Progress Notes (Signed)
Patient examined, no change in last 1 hour.  SVE:   5/50%/-4, above the inlet.  FHT- category I  Assessment / Plan: Inlet CPD vs unflexed, unengaged head, no descent, head above the inlet D/c pitocin and proceed with C-section as soon as OR available.  Risks/complications of surgery reviewed incl infection, bleeding, damage to internal organs including bladder, bowels, ureters, blood vessels, other risks from anesthesia, VTE and delayed complications of any surgery, complications in future surgery reviewed. Also discussed neonatal complications incl difficult delivery, laceration, vacuum assistance, TTN etc. Pt understands and agrees, all concerns addressed.    Mel Tadros R 04/30/2016

## 2016-04-30 NOTE — Anesthesia Procedure Notes (Signed)
Epidural Patient location during procedure: OB Start time: 04/30/2016 1:56 PM  Staffing Anesthesiologist: Mal AmabileFOSTER, Sidda Humm Performed by: anesthesiologist   Preanesthetic Checklist Completed: patient identified, site marked, surgical consent, pre-op evaluation, timeout performed, IV checked, risks and benefits discussed and monitors and equipment checked  Epidural Patient position: sitting Prep: site prepped and draped and DuraPrep Patient monitoring: continuous pulse ox and blood pressure Approach: midline Location: L3-L4 Injection technique: LOR air  Needle:  Needle type: Tuohy  Needle gauge: 17 G Needle length: 9 cm and 9 Needle insertion depth: 8 cm Catheter type: closed end flexible Catheter size: 19 Gauge Catheter at skin depth: 14 cm Test dose: negative and Other  Assessment Events: blood not aspirated, injection not painful, no injection resistance, negative IV test and no paresthesia  Additional Notes Patient identified. Risks and benefits discussed including failed block, incomplete  Pain control, post dural puncture headache, nerve damage, paralysis, blood pressure Changes, nausea, vomiting, reactions to medications-both toxic and allergic and post Partum back pain. All questions were answered. Patient expressed understanding and wished to proceed. Sterile technique was used throughout procedure. Epidural site was Dressed with sterile barrier dressing. No paresthesias, signs of intravascular injection Or signs of intrathecal spread were encountered.  Patient was more comfortable after the epidural was dosed. Please see RN's note for documentation of vital signs and FHR which are stable.

## 2016-04-30 NOTE — Transfer of Care (Signed)
Immediate Anesthesia Transfer of Care Note  Patient: Caroline Howe  Procedure(s) Performed: Procedure(s): CESAREAN SECTION (N/A)  Patient Location: PACU  Anesthesia Type:Epidural  Level of Consciousness: awake, alert  and oriented  Airway & Oxygen Therapy: Patient Spontanous Breathing  Post-op Assessment: Report given to RN and Post -op Vital signs reviewed and stable  Post vital signs: Reviewed and stable  Last Vitals:  Filed Vitals:   04/30/16 1932 04/30/16 2002  BP: 130/77 130/70  Pulse: 92   Temp:    Resp: 16 14    Last Pain:  Filed Vitals:   04/30/16 2106  PainSc: 0-No pain      Patients Stated Pain Goal: 3 (04/30/16 1203)  Complications: No apparent anesthesia complications

## 2016-04-30 NOTE — Progress Notes (Signed)
Dr. Juliene PinaMody at bedside discussing risks and benefits of C/S with pt. Questions answered. Consents signed.

## 2016-04-30 NOTE — Anesthesia Preprocedure Evaluation (Addendum)
Anesthesia Evaluation  Patient identified by MRN, date of birth, ID band Patient awake    Reviewed: Allergy & Precautions, Patient's Chart, lab work & pertinent test results  Airway Mallampati: III  TM Distance: >3 FB Neck ROM: Full    Dental no notable dental hx. (+) Teeth Intact   Pulmonary neg pulmonary ROS,    Pulmonary exam normal breath sounds clear to auscultation       Cardiovascular negative cardio ROS Normal cardiovascular exam Rhythm:Regular Rate:Normal     Neuro/Psych PSYCHIATRIC DISORDERS Anxiety negative neurological ROS     GI/Hepatic Neg liver ROS, GERD  Controlled and Medicated,  Endo/Other  Hypothyroidism Morbid obesity  Renal/GU negative Renal ROS  negative genitourinary   Musculoskeletal negative musculoskeletal ROS (+)   Abdominal (+) + obese,   Peds  Hematology negative hematology ROS (+)   Anesthesia Other Findings   Reproductive/Obstetrics (+) Pregnancy                             Anesthesia Physical Anesthesia Plan  ASA: III and emergent  Anesthesia Plan: Epidural   Post-op Pain Management:    Induction:   Airway Management Planned: Natural Airway  Additional Equipment:   Intra-op Plan:   Post-operative Plan:   Informed Consent: I have reviewed the patients History and Physical, chart, labs and discussed the procedure including the risks, benefits and alternatives for the proposed anesthesia with the patient or authorized representative who has indicated his/her understanding and acceptance.   Dental advisory given  Plan Discussed with: Anesthesiologist, CRNA and Surgeon  Anesthesia Plan Comments: (Patient for C/Section for arrest of dilation. Will use epidural for C/Section. M. Uzair Godley,MD)      Anesthesia Quick Evaluation

## 2016-04-30 NOTE — Progress Notes (Signed)
Caroline Howe is a 30 y.o. G1P0 at 2761w1d by ultrasound admitted for induction of labor due to hypothyroidism. Maternal obesity, high station, borderline pelvis.   Subjective: Slept with Stadol. Feels some pain with UCs.   Objective: BP 152/76 mmHg  Pulse 81  Temp(Src) 98.1 F (36.7 C) (Oral)  Resp 16  Ht 5\' 4"  (1.626 m)  Wt 279 lb (126.554 kg)  BMI 47.87 kg/m2  SpO2 100%  LMP 05/31/2015   FHT:  FHR: 140 bpm, variability: moderate,  accelerations:  Present,  decelerations:  Absent UC:   every 2-3 minutes with intermittent coupling. Pitocin at 10 mu rate SVE:  1/ 30%/ -5. Pt not tolerating exam. Intracervical foley placed and stuck to thigh on tension  Assessment / Plan: Labor IOL. Protracted latent phase. High station, borderline pelvis, watch for inlet CPD since head not engaged. On pitocin, increase as needed to keep active labor pattern. Tried all positions and peanut to aid descent have failed.  Preeclampsia:  No symptoms, elevated BP, PEC labs and urine prot/creat ratio normal  Fetal Wellbeing:  Category I Pain Control:  IV med now, epidural in active labor I/D:  GBS(-) Anticipated MOD:  guarded . High chance of C/section discussed/  Caroline Howe 04/30/2016, 10:23 AM

## 2016-04-30 NOTE — Progress Notes (Signed)
Caroline Howe is a 30 y.o. G1P0 at 3649w1d by 1st trim sono. IOL for hypothyroidism since 7 am yesterday. S/p Cytotec x 2, limited pitocin yesterday since contractions q 2-5 minutes with no change in cervix until 1 cm this morning, then foley catheter.   Subjective: Feels better with epidural.   Objective: BP 139/83 mmHg  Pulse 86  Temp(Src) 98.4 F (36.9 C) (Oral)  Resp 16  Ht 5\' 4"  (1.626 m)  Wt 279 lb (126.554 kg)  BMI 47.87 kg/m2  SpO2 99%  LMP 05/31/2015    FHT:  FHR: 150 bpm, variability: minimal ,  accelerations:  Present,  decelerations:  Absent UC:   regular, every 2 minutes SVE:   Dilation: 4 Effacement (%): 50 Station: Ballotable (-5) Exam by:: Dr. Juliene PinaMody  Foley bulb noted expelled in the vagina, was removed.  Controlled AROM with fundal and suprapubic pressure done, clear fluid. Head applied to cervix but high station above the inlet.    Assessment / Plan: [facility],  progressing well on pitocin but high station, suspected CPD Fetal Wellbeing:  Category I Pain Control:  Epidural I/D:  n/a Anticipated MOD:  High chance of C/section, assess descent.   Caroline Howe 04/30/2016, 4:03 PM

## 2016-04-30 NOTE — Anesthesia Postprocedure Evaluation (Signed)
Anesthesia Post Note  Patient: Caroline Howe  Procedure(s) Performed: Procedure(s) (LRB): CESAREAN SECTION (N/A)  Patient location during evaluation: PACU Anesthesia Type: Epidural Level of consciousness: awake and alert and oriented Pain management: pain level controlled Vital Signs Assessment: post-procedure vital signs reviewed and stable Respiratory status: spontaneous breathing, nonlabored ventilation and respiratory function stable Cardiovascular status: blood pressure returned to baseline and stable Postop Assessment: no signs of nausea or vomiting, patient able to bend at knees, epidural receding, no backache and no headache Anesthetic complications: no     Last Vitals:  Filed Vitals:   04/30/16 2240 04/30/16 2245  BP:  110/66  Pulse: 90 91  Temp:  37.8 C  Resp: 18 16    Last Pain:  Filed Vitals:   04/30/16 2259  PainSc: 3    Pain Goal: Patients Stated Pain Goal: 3 (04/30/16 1203)               Kern Gingras A.

## 2016-04-30 NOTE — Op Note (Signed)
Caroline Howe 04/30/2016  Procedure: Primary Low Transverse Cesarean Section   Indications: 30 yo, G1, 40 wk induction for hypothyroidism. 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   Post-operative Diagnosis: Same   Surgeon: Shea EvansVaishali Makala Fetterolf, MD   Assistants: Arlan Organaniela Paul, CNM   Anesthesia: epidural   Procedure Details:  The patient was seen in the Labor Room.  Baby remained above pelvic inlet despite several maneuvers, macrosomia/ large head/ inlet dystocia was suspected. Cesarean delivery was advised. The risks, benefits, complications, treatment options, and expected outcomes were discussed with the patient. The patient concurred with the proposed plan, giving informed consent. identified as Caroline Howe and the procedure verified as C-Section Delivery.  A Time Out was held and the above information confirmed. 3 gm Ancef given. Foley was draining well.  After induction of anesthesia, the patient was draped and prepped in the usual sterile manner. A Pfannenstiel Incision was made and carried down through the subcutaneous tissue to the fascia. Fascial incision was made and extended transversely. The fascia was separated from the underlying rectus tissue superiorly and inferiorly. The peritoneum was identified and entered. Peritoneal incision was extended longitudinally. Alexis retractor placed. The utero-vesical peritoneal reflection was incised transversely and the bladder flap was bluntly freed from the lower uterine segment. A low transverse uterine incision was made. Clear amniotic fluid drained. Unengaged head was palpated and baby was delivered cephalic at 20.47 hours on 04/30/2016. Loose nuchal cord released over his shoulder. Delayed cord clamping done. Baby handed to NICU team in attendance. Apgar scores of 7  at one minute and 9 at five minutes. Cord ph was not sent. Cord blood was obtained for evaluation. Ring clamps applied to bleeding lower segment sinuses along the hysterotomy. The placenta was removed Intact and appeared normal. The uterine outline, tubes and ovaries appeared normal, no extensions noted. The uterine incision was closed with running locked sutures of 0Monocryl followed by a second imbricating layer. Hemostasis was observed. Alexis retractor removed. Peritoneal closure done with 2-0 Vicryl. The fascia was then reapproximated with running sutures of 0Vicryl. The subcuticular closure was performed using 2-0plain gut. The skin was closed with 4-0Vicryl. Steristrips, Honeycomb dressing and pressure dressing applied.   Instrument, sponge, and needle counts were correct prior the abdominal closure and were correct at the conclusion of the case.    Findings:  Female infant delivered cephalic from unengaged position above the pelvic inlet at 20.47 hrs on 04/30/16. Loose nuchal cord x 1 reduced at head delivery. 2 layer closure of hysterotomy performed. Weight pending.    Estimated Blood Loss: 1500 cc   Total IV Fluids: 3500 ml LR   Urine Output: 200CC OF clear urine  Specimens: Cord blood   Complications: no complications  Disposition: PACU - hemodynamically stable.   Maternal Condition: stable   Baby condition / location:  Couplet care / Skin to Skin  Attending Attestation: I performed the procedure.   Signed: Surgeon(s): Shea EvansVaishali Anton Cheramie, MD

## 2016-04-30 NOTE — Progress Notes (Signed)
Ludwig LeanSarah Rampey is a 30 y.o. G1P0 at 2828w1d by 1st trim sono. IOL for hypothyroidism since 7 am yesterday. Protracted latent phase, no fetal descent   Objective: BP 130/77 mmHg  Pulse 92  Temp(Src) 98.5 F (36.9 C) (Oral)  Resp 16  Ht 5\' 4"  (1.626 m)  Wt 279 lb (126.554 kg)  BMI 47.87 kg/m2  SpO2 99%  LMP 05/31/2015 I/O last 3 completed shifts: In: -  Out: 850 [Urine:850]  FHT:  FHR: 150 bpm, variability: minimal ,  accelerations:  Present,  decelerations:  Absent UC:   regular, every 2 minutes SVE:   5/50%/-4, above the inlet.    Assessment / Plan: Inlet CPD vs unflexed, unengaged head, no descent, head above the inlet Fetal Wellbeing:  Category I Pain Control:  Epidural I/D:  n/a Anticipated MOD:  recc C/section. We are delayed due to another emergency, will continue pitocin and reassess at 8-8.15 pm/   Risks/complications of surgery reviewed incl infection, bleeding, damage to internal organs including bladder, bowels, ureters, blood vessels, other risks from anesthesia, VTE and delayed complications of any surgery, complications in future surgery reviewed. Also discussed neonatal complications incl difficult delivery, laceration, vacuum assistance, TTN etc. Pt understands and agrees, all concerns addressed.      Anavictoria Wilk R 04/30/2016

## 2016-05-01 ENCOUNTER — Encounter (HOSPITAL_COMMUNITY): Payer: Self-pay | Admitting: Obstetrics & Gynecology

## 2016-05-01 LAB — CBC
HCT: 31.5 % — ABNORMAL LOW (ref 36.0–46.0)
HEMOGLOBIN: 10.2 g/dL — AB (ref 12.0–15.0)
MCH: 29.8 pg (ref 26.0–34.0)
MCHC: 32.4 g/dL (ref 30.0–36.0)
MCV: 92.1 fL (ref 78.0–100.0)
PLATELETS: 165 10*3/uL (ref 150–400)
RBC: 3.42 MIL/uL — AB (ref 3.87–5.11)
RDW: 14.7 % (ref 11.5–15.5)
WBC: 17.6 10*3/uL — AB (ref 4.0–10.5)

## 2016-05-01 MED ORDER — IBUPROFEN 600 MG PO TABS
600.0000 mg | ORAL_TABLET | Freq: Four times a day (QID) | ORAL | Status: DC
  Administered 2016-05-01 – 2016-05-03 (×10): 600 mg via ORAL
  Filled 2016-05-01 (×10): qty 1

## 2016-05-01 MED ORDER — ACETAMINOPHEN 325 MG PO TABS: 650.0000 mg | ORAL_TABLET | ORAL | Status: DC | PRN

## 2016-05-01 MED ORDER — SIMETHICONE 80 MG PO CHEW
80.0000 mg | CHEWABLE_TABLET | ORAL | Status: DC | PRN
  Administered 2016-05-02: 80 mg via ORAL

## 2016-05-01 MED ORDER — PRENATAL MULTIVITAMIN CH
1.0000 | ORAL_TABLET | Freq: Every day | ORAL | Status: DC
  Administered 2016-05-01 – 2016-05-03 (×3): 1 via ORAL
  Filled 2016-05-01 (×3): qty 1

## 2016-05-01 MED ORDER — NALBUPHINE HCL 10 MG/ML IJ SOLN: 5.0000 mg | Freq: Once | INTRAMUSCULAR | Status: DC | PRN

## 2016-05-01 MED ORDER — LORATADINE 10 MG PO TABS: 10.0000 mg | ORAL_TABLET | Freq: Every day | ORAL | Status: DC | PRN

## 2016-05-01 MED ORDER — LACTATED RINGERS IV SOLN
INTRAVENOUS | Status: DC
  Administered 2016-05-01: 999 mL via INTRAVENOUS
  Administered 2016-05-01: 09:00:00 via INTRAVENOUS

## 2016-05-01 MED ORDER — NALBUPHINE HCL 10 MG/ML IJ SOLN: 5.0000 mg | INTRAMUSCULAR | Status: DC | PRN

## 2016-05-01 MED ORDER — DIPHENHYDRAMINE HCL 25 MG PO CAPS: 25.0000 mg | ORAL_CAPSULE | ORAL | Status: DC | PRN

## 2016-05-01 MED ORDER — DIPHENHYDRAMINE HCL 50 MG/ML IJ SOLN: 12.5000 mg | INTRAMUSCULAR | Status: DC | PRN

## 2016-05-01 MED ORDER — MENTHOL 3 MG MT LOZG: 1.0000 | LOZENGE | OROMUCOSAL | Status: DC | PRN

## 2016-05-01 MED ORDER — SODIUM CHLORIDE 0.9% FLUSH: 3.0000 mL | INTRAVENOUS | Status: DC | PRN

## 2016-05-01 MED ORDER — OXYCODONE-ACETAMINOPHEN 5-325 MG PO TABS
2.0000 | ORAL_TABLET | ORAL | Status: DC | PRN
  Administered 2016-05-03: 2 via ORAL
  Filled 2016-05-01: qty 2

## 2016-05-01 MED ORDER — NALOXONE HCL 2 MG/2ML IJ SOSY
1.0000 ug/kg/h | PREFILLED_SYRINGE | INTRAVENOUS | Status: DC | PRN
  Filled 2016-05-01: qty 2

## 2016-05-01 MED ORDER — ONDANSETRON HCL 4 MG/2ML IJ SOLN: 4.0000 mg | Freq: Three times a day (TID) | INTRAMUSCULAR | Status: DC | PRN

## 2016-05-01 MED ORDER — RHO D IMMUNE GLOBULIN 1500 UNIT/2ML IJ SOSY
300.0000 ug | PREFILLED_SYRINGE | Freq: Once | INTRAMUSCULAR | Status: AC
  Administered 2016-05-01: 300 ug via INTRAVENOUS
  Filled 2016-05-01: qty 2

## 2016-05-01 MED ORDER — ACETAMINOPHEN 500 MG PO TABS
1000.0000 mg | ORAL_TABLET | Freq: Four times a day (QID) | ORAL | Status: AC
  Administered 2016-05-01 (×3): 1000 mg via ORAL
  Filled 2016-05-01 (×3): qty 2

## 2016-05-01 MED ORDER — OXYTOCIN 40 UNITS IN LACTATED RINGERS INFUSION - SIMPLE MED: 2.5000 [IU]/h | INTRAVENOUS | Status: AC

## 2016-05-01 MED ORDER — NALBUPHINE HCL 10 MG/ML IJ SOLN
5.0000 mg | INTRAMUSCULAR | Status: DC | PRN
  Administered 2016-05-01: 5 mg via INTRAVENOUS
  Filled 2016-05-01: qty 1

## 2016-05-01 MED ORDER — SIMETHICONE 80 MG PO CHEW
80.0000 mg | CHEWABLE_TABLET | ORAL | Status: DC
  Administered 2016-05-02: 80 mg via ORAL
  Filled 2016-05-01 (×2): qty 1

## 2016-05-01 MED ORDER — ZOLPIDEM TARTRATE 5 MG PO TABS: 5.0000 mg | ORAL_TABLET | Freq: Every evening | ORAL | Status: DC | PRN

## 2016-05-01 MED ORDER — LEVOTHYROXINE SODIUM 112 MCG PO TABS
112.0000 ug | ORAL_TABLET | Freq: Every day | ORAL | Status: DC
Start: 2016-05-01 — End: 2016-05-03
  Administered 2016-05-01 – 2016-05-03 (×3): 112 ug via ORAL
  Filled 2016-05-01 (×4): qty 1

## 2016-05-01 MED ORDER — WITCH HAZEL-GLYCERIN EX PADS: 1.0000 "application " | MEDICATED_PAD | CUTANEOUS | Status: DC | PRN

## 2016-05-01 MED ORDER — OXYCODONE-ACETAMINOPHEN 5-325 MG PO TABS
1.0000 | ORAL_TABLET | ORAL | Status: DC | PRN
  Administered 2016-05-02 (×2): 1 via ORAL
  Filled 2016-05-01 (×2): qty 1

## 2016-05-01 MED ORDER — DIBUCAINE 1 % RE OINT: 1.0000 "application " | TOPICAL_OINTMENT | RECTAL | Status: DC | PRN

## 2016-05-01 MED ORDER — DIPHENHYDRAMINE HCL 25 MG PO CAPS: 25.0000 mg | ORAL_CAPSULE | Freq: Four times a day (QID) | ORAL | Status: DC | PRN

## 2016-05-01 MED ORDER — SENNOSIDES-DOCUSATE SODIUM 8.6-50 MG PO TABS
2.0000 | ORAL_TABLET | ORAL | Status: DC
  Administered 2016-05-02 (×2): 2 via ORAL
  Filled 2016-05-01 (×2): qty 2

## 2016-05-01 MED ORDER — IBUPROFEN 600 MG PO TABS: 600.0000 mg | ORAL_TABLET | Freq: Four times a day (QID) | ORAL | Status: DC | PRN

## 2016-05-01 MED ORDER — COCONUT OIL OIL: 1.0000 "application " | TOPICAL_OIL | Status: DC | PRN

## 2016-05-01 MED ORDER — NALOXONE HCL 0.4 MG/ML IJ SOLN: 0.4000 mg | INTRAMUSCULAR | Status: DC | PRN

## 2016-05-01 MED ORDER — SIMETHICONE 80 MG PO CHEW
80.0000 mg | CHEWABLE_TABLET | Freq: Three times a day (TID) | ORAL | Status: DC
  Administered 2016-05-01 – 2016-05-03 (×7): 80 mg via ORAL
  Filled 2016-05-01 (×7): qty 1

## 2016-05-01 NOTE — Lactation Note (Signed)
This note was copied from a baby's chart. Lactation Consultation Note  Initial visit made.  Breastfeeding consultation services and support information given to patient.  Baby is 5513 hours old.  He has been sleepy and not showing interest in feeding.  Baby has also been spitty and gaggy.  Baby unwrapped from blankets.  Assisted with positioning baby in football hold.  Baby gagging on mucus and showing not interest in feeding.  Small drop of colostrum hand expressed.  Mom has also been using hand pump for stimulation.  Reassured mom and instructed to watch for feeding cues.  Mom will call lactation for breastfeeding assist with cues.  If baby has not latched by 1700 symphony pump should be initiated.  Patient Name: Caroline Ludwig LeanSarah Fuster [id number]'s Date: 05/01/2016 Reason for consult: Initial assessment   Maternal Data Has patient been taught Hand Expression?: Yes Does the patient have breastfeeding experience prior to this delivery?: No  Feeding Feeding Type: Breast Fed Length of feed:  (too sleepy for latch)  LATCH Score/Interventions Latch: Too sleepy or reluctant, no latch achieved, no sucking elicited. Intervention(s): Skin to skin;Teach feeding cues;Waking techniques Intervention(s): Breast compression;Breast massage;Assist with latch;Adjust position  Audible Swallowing: None  Type of Nipple: Everted at rest and after stimulation  Comfort (Breast/Nipple): Soft / non-tender     Hold (Positioning): Assistance needed to correctly position infant at breast and maintain latch. Intervention(s): Breastfeeding basics reviewed;Support Pillows;Position options;Skin to skin  LATCH Score: 5  Lactation Tools Discussed/Used     Consult Status Consult Status: Follow-up Date: 05/01/16 Follow-up type: In-patient    Huston FoleyMOULDEN, Tavoris Brisk S 05/01/2016, 10:46 AM

## 2016-05-01 NOTE — Addendum Note (Signed)
Addendum  created 05/01/16 1002 by Shanon PayorSuzanne M Micalah Cabezas, CRNA   Modules edited: Clinical Notes   Clinical Notes:  File: [id number]

## 2016-05-01 NOTE — Anesthesia Postprocedure Evaluation (Signed)
Anesthesia Post Note  Patient: Caroline Howe  Ludwig Leanrocedure(s) Performed: Procedure(s) (LRB): CESAREAN SECTION (N/A)  Patient location during evaluation: Mother Baby Anesthesia Type: Epidural Level of consciousness: awake and alert and oriented Pain management: pain level controlled Vital Signs Assessment: post-procedure vital signs reviewed and stable Respiratory status: spontaneous breathing and nonlabored ventilation Cardiovascular status: stable Postop Assessment: no headache, no backache, patient able to bend at knees, no signs of nausea or vomiting and adequate PO intake Anesthetic complications: no     Last Vitals:  Filed Vitals:   05/01/16 0245 05/01/16 0640  BP:  111/47  Pulse: 78 60  Temp: 37.3 C 36.9 C  Resp: 20     Last Pain:  Filed Vitals:   05/01/16 0645  PainSc: Asleep   Pain Goal: Patients Stated Pain Goal: 3 (04/30/16 1203)               Madison HickmanGREGORY,Andie Mortimer

## 2016-05-01 NOTE — Clinical Social Work Maternal (Signed)
  CLINICAL SOCIAL WORK MATERNAL/CHILD NOTE  Patient Details  Name: Caroline Howe MRN: [id number] Date of Birth: 1985-12-22  Date:  05/01/2016  Clinical Social Worker Initiating Note:  Laurey Arrow Date/ Time Initiated:  05/01/16/1031     Child's Name:  Caroline Howe   Legal Guardian:  Mother   Need for Interpreter:  None   Date of Referral:  05/01/16     Reason for Referral:  Behavioral Health Issues, including SI    Referral Source:  Central Nursery   Address:  [address] [facility] [address] [place]  Phone number:  [phone]   Household Members:  Self, Spouse   Natural Supports (not living in the home):  Extended Family, Immediate Family, Friends, Spouse/significant other, Parent   Professional Supports: Case Metallurgist Scientist, forensic)   Employment:     Type of Work:     Education:  Diplomatic Services operational officer Resources:  Multimedia programmer   Other Resources:    Cultural/Religious Considerations Which May Impact Care:  None reported  Strengths:  Ability to meet basic needs , Home prepared for child , Understanding of illness   Risk Factors/Current Problems:  Mental Health Concerns    Cognitive State:  Alert , Insightful , Goal Oriented    Mood/Affect:  Calm , Comfortable , Interested , Relaxed    CSW Assessment: CSW met with MOB to complete an assessment for a consult for hx of anxiety.  MOB was polite, inviting, and interested with meeting with CSW.  MOB introduced her room guest as husband/FOB Christoper Fabian) and sister Estill Bamberg).  MOB gave CSW permission to speak with MOB while her guest were present.  MOB stated that she has a wealth of supports from her husband, parents, siblings, in-laws, and friends. MOB expressed that she and FOB are excited about being new parents, and feel like they are well prepared for the baby.  CSW inquired about MOB's hx of anxiety, and MOB communicated she was dx with anxiety at age 73.  MOB also communicated  that 2 years ago she was dx with OCD. MOB communicated she is not taking any medication for her OCD; however, she meets with an outpatient behavior health therapist 1x weekly at the [facility]. CSW praised MOB for being proactive with meeting her [place] needs.   CSW educated MOB and about PPD.  CSW informed MOB of possible supports and interventions to decrease PPD.  CSW also encouraged MOB to seek medical attention if needed for increased signs and symptoms for PPD. CSW reviewed safe sleep, and SIDS. MOB  was knowledgeable, and communicated to CSW that she took a Child Birthing/Baby class prior to given birth. CSW thanked MOB for her honesty and willingness to meet with CSW.  MOB did not have any further questions, concerns, or needs at this time.    FOB and MOB's sister did not actively participate with CSW, however they were attentive to the infant.  CSW Plan/Description:  Patient/Family Education , No Further Intervention Required/No Barriers to Discharge    Taylia Berber D BOYD-GILYARD, LCSW 05/01/2016, 10:35 AM

## 2016-05-01 NOTE — Lactation Note (Signed)
This note was copied from a baby's chart. Lactation Consultation Note  Patient Name: Caroline Howe ZOXWR'UToday's Date: 05/01/2016 Reason for consult: Follow-up assessment  Infant has no interest in eating (s/p circ) at this time. I did give him about 1mL of colostrum on a gloved finger, but it did not entice him to latch. He also received 2mL of colostrum via spoon twice earlier today.   I did hand expression w/Mom & obtained some more colostrum to be used to entice him for eating later/or for when he awakens. Mom reports + minimal breast changes w/pregnancy, but she yields colostrum pretty easily.   "Caroline Howe" was left skin-to-skin w/Mom. Lurline HareRichey, Gerold Sar [facility] 05/01/2016, 10:32 PM

## 2016-05-01 NOTE — Progress Notes (Addendum)
S/P Primary Cesarean Delivery for Protracted latent phase and active phase arrest Subjective: POD# 1 Information for the patient's newborn:  Caroline Howe, Caroline Howe [[id number]][[id number]]  female  / circ planning  Reports feeling tired, but well. Feeding: breast Patient reports tolerating PO.  Breast symptoms: none Pain controlled with ibuprofen (OTC) and narcotic analgesics including Percocet Denies HA/SOB/C/P/N/V/dizziness. Flatus absent. No BM. She reports vaginal bleeding as normal, without clots.  She is not ambulating - attempted to walk with assistance this AM, but unable to take any steps / RN will reassess at 1045, Foley indwelling, draining clear urine to gravity.      Objective:   VS:  Filed Vitals:   05/01/16 0033 05/01/16 0130 05/01/16 0245 05/01/16 0640  BP: 111/48 121/46  111/47  Pulse: 85 85 78 60  Temp: 99.5 F (37.5 C) 99.2 F (37.3 C) 99.1 F (37.3 C) 98.4 F (36.9 C)  TempSrc: Axillary Axillary Axillary   Resp: 20 20 20    Height:      Weight:      SpO2: 94% 94% 91% 96%     Intake/Output Summary (Last 24 hours) at 05/01/16 0947 Last data filed at 05/01/16 [id number]  Gross per 24 hour  Intake   4222 ml  Output   4075 ml  Net    147 ml        Recent Labs  04/30/16 0013 05/01/16 0607  WBC 13.5* 17.6*  HGB 13.2 10.2*  HCT 38.1 31.5*  PLT 171 165     Blood type: --/--/A NEG (07/03 0815) / Infant Rh POS / Rhophylac indicated  Rubella: Immune (12/06 0000)     Physical Exam:   General: alert, cooperative, fatigued and no distress  CV: Regular rate and rhythm, S1S2 present or without murmur or extra heart sounds  Resp: clear  Abdomen: soft, nontender, normal bowel sounds  Incision: pressure dressing in place - some vaginal bleeding stained on lower mid area of dressing  Uterine Fundus: firm, 1 FB below umbilicus, nontender  Lochia: minimal  Ext: edema trace and Homans sign is negative, no sign of DVT   Assessment/Plan: 30 y.o.   POD# 1.  S/P Cesarean Delivery.   Indications: arrest of active phase of labor                Principal Problem:   Postpartum care following cesarean delivery (7/4) Active Problems:   Hypothyroidism   Hypothyroid in pregnancy, antepartum   Cesarean delivery delivered  Doing well, stable.               Regular diet as tolerated D/C foley and IV per unit protocol Shower today and remove pressure dressing today Ambulate Routine post-op care  Kenard GowerAWSON, Caroline Howe, M, MSN, CNM 05/01/2016, 9:47 AM

## 2016-05-02 LAB — RH IG WORKUP (INCLUDES ABO/RH)
ABO/RH(D): A NEG
Fetal Screen: NEGATIVE
GESTATIONAL AGE(WKS): 40.1
Unit division: 0

## 2016-05-02 LAB — BIRTH TISSUE RECOVERY COLLECTION (PLACENTA DONATION)

## 2016-05-02 NOTE — Lactation Note (Signed)
This note was copied from a baby's chart. Lactation Consultation Note Baby was circumcised today, has been sleepy since w/no interest in BF. Getting fussy and rooting. In football hold put baby to breast. Suck a couple of times then stop. Mom has tubular soft breast w/nipples to end of breast. Placed rolled cloth under Rt. Breast to lift soft breast. Taught "C" hold to firm breast and lift up. W/sand which hold latched baby to nipple. When baby wasn't suckling nipple came out. Baby couldn't maintain suction to hold nipple into mouth. When taken away baby cried. Baby would loose suction of nipple and suck on bottom lip. Has slight recessed chin. Thick upper labial frenulum, can't move tongue past gum line at this time. Suck training to stimulate tongue movement to suck. Hand expressed 3 ml colostrum. W/gloved finger and curve tip syring colostrum given. Fitted mom w/#24 NS, and #20 NS. #20 fit good. Inserted colostrum into NS. Baby wouldn't suck but a few times. Baby would sleep at breast. Shells given to mom, and applied in bra to assist in everting nipples. Baby appears to be slight jaundice. Reported to RN.  Patient Name: Caroline Howe Reason for consult: Follow-up assessment;Difficult latch   Maternal Data    Feeding Feeding Type: Breast Fed Length of feed: 2 min  LATCH Score/Interventions Latch: Too sleepy or reluctant, no latch achieved, no sucking elicited. Intervention(s): Skin to skin;Teach feeding cues;Waking techniques Intervention(s): Adjust position;Assist with latch;Breast massage;Breast compression  Audible Swallowing: A few with stimulation Intervention(s): Skin to skin;Hand expression Intervention(s): Skin to skin;Hand expression;Alternate breast massage  Type of Nipple: Flat Intervention(s): Shells;Hand pump;Double electric pump  Comfort (Breast/Nipple): Soft / non-tender     Hold (Positioning): Full assist, staff holds infant at  breast Intervention(s): Support Pillows;Position options;Skin to skin;Breastfeeding basics reviewed  LATCH Score: 4  Lactation Tools Discussed/Used Tools: Shells;Nipple Dorris CarnesShields;Pump Nipple shield size: 20 Shell Type: Inverted Breast pump type: Double-Electric Breast Pump   Consult Status Consult Status: Follow-up Date: 05/02/16 Follow-up type: In-patient    Caroline Howe, Caroline Howe Howe, 2:34 AM

## 2016-05-02 NOTE — Lactation Note (Signed)
This note was copied from a baby's chart. Lactation Consultation Note  Patient Name: Caroline Howe LeanSarah Mintzer JXBJY'NToday's Date: 05/02/2016 Reason for consult: Follow-up assessment;Difficult latch  Baby 43 hours old and has not been feeding well. Parents voiced concerns and MBU RN taking care of baby.  Per mom and dad the baby has a short frenulum under upper lip,  And the Pedis Dr. Claiborne Billingshat came in today mentioned it.  LC assessed baby's oral cavity and noted a high palate, significant recessed chin,  Short labial frenulum, upper lip stretches fairly well with exam and when latched with NS #24.  Short anterior frenulum noted, limited mobility of tongue over gum line, also when sucking on  LC 's gloved finger intermittently humps the back of the tongue. Suck weak at 1st and then the  Baby was able to get into a sucking pattern on LC's finger.  LC started by having mom han express, LC reviewed and assisted mom , tiny drops noted  Mom reports a few breast changes with pregnancy - sensitive nipples and breast, areola darkened, no change in size.  LC set up the DEBP and had mom pump for 10 mins ,release right side and assisted with baby latching on the right side with a #24 NS  ( after sizing and the #20 NS was to small , #24 NS fit better). LC instilled 3 ml in the top of the NS , at 1st the baby mouthed it and then latched  With assistance with depth and baby fed 12 mins , with multiply swallows. LC tried adding the 43F feeding tube on the side of the NS and was unable to  Get a secure seal so finished the feeding at the breast. Finger fed 7 ml from a purple syringe/ and baby tolerated well.  Baby acting still hungry , re-latched with #24 NS and instilling 3 ml in the top of the NS and baby fed for about 7 mins and released.  Baby satisfied and fell asleep skin to skin with mom.  LC reviewed LC Plan stressing the importance of consistent pumping around the clock at least every 3 hours after the attempt/ feeding at  he breast .  LC recommended feeding the baby in football position with #24 NS with appetizer. After feeding finger feed as shown ( call for assist with SNS if needed)  And post pump 15 mins , save milk for next feeding.  #24 Flange a good fit.  LC informed MBU RN - Moshe CiproStephanie Matthews [facility] Plan and progress with feeding and parents relief baby was now feeding.    Maternal Data Has patient been taught Hand Expression?: Yes  Feeding Feeding Type: Formula Length of feed: 7 min  LATCH Score/Interventions Latch: Repeated attempts needed to sustain latch, nipple held in mouth throughout feeding, stimulation needed to elicit sucking reflex. Intervention(s): Skin to skin;Teach feeding cues;Waking techniques Intervention(s): Adjust position;Assist with latch;Breast massage;Breast compression  Audible Swallowing: Spontaneous and intermittent  Type of Nipple: Everted at rest and after stimulation  Comfort (Breast/Nipple): Soft / non-tender     Hold (Positioning): Full assist, staff holds infant at breast Intervention(s): Breastfeeding basics reviewed;Support Pillows;Position options;Skin to skin  LATCH Score: 7  Lactation Tools Discussed/Used Tools: Nipple Shields Nipple shield size: 24 Shell Type: Inverted Breast pump type: Double-Electric Breast Pump   Consult Status Consult Status: Follow-up Date: 05/03/16 Follow-up type: In-patient    Kathrin Greathouseorio, Selvin Yun Ann 05/02/2016, 4:53 PM

## 2016-05-02 NOTE — Progress Notes (Signed)
S/P Primary Cesarean Delivery for Protracted latent phase and active phase arrest Subjective: POD# 2 Information for the patient's newborn:  Dione BoozeCarlin, Boy Icie [[id number]][[id number]]  female   / circ done  Reports feeling tired, but well. Feeding: breast Patient reports tolerating PO.  Breast symptoms: none Pain controlled with ibuprofen (OTC) and narcotic analgesics including Percocet Denies HA/SOB/C/P/N/V/dizziness. Flatus present. No BM. She reports vaginal bleeding as normal, without clots.  She is ambulating without assistance. "Did have to be recatheterized last night", but has urinated twice on her own without difficulty.        Objective:   VS:  Filed Vitals:   05/01/16 1000 05/01/16 1400 05/01/16 1827 05/01/16 2210  BP: 113/46 118/56 128/73 135/64  Pulse: 60 66 77 86  Temp: 98.5 F (36.9 C) 98.1 F (36.7 C) 97.7 F (36.5 C) 98.5 F (36.9 C)  TempSrc: Axillary Oral Oral Oral  Resp: 20 20 18 18   Height:      Weight:      SpO2: 100%   96%      Intake/Output Summary (Last 24 hours) at 05/02/16 0715 Last data filed at 05/01/16 1900  Gross per 24 hour  Intake      0 ml  Output   1025 ml  Net  -1025 ml         Recent Labs  04/30/16 0013 05/01/16 0607  WBC 13.5* 17.6*  HGB 13.2 10.2*  HCT 38.1 31.5*  PLT 171 165     Blood type: --/--/A NEG (07/05 0607) / Infant Rh POS / Rhophylac 05/01/2016  Rubella: Immune (12/06 0000)     Physical Exam:   General: alert, cooperative, fatigued and no distress  CV: Regular rate and rhythm, S1S2 present or without murmur or extra heart sounds  Resp: clear  Abdomen: soft, nontender, normal bowel sounds  Incision: pressure dressing in place - some vaginal bleeding stained on lower mid area of dressing  Uterine Fundus: firm, 1 FB below umbilicus, nontender  Lochia: minimal  Ext: edema trace and Homans sign is negative, no sign of DVT   Assessment/Plan: 30 y.o.   POD# 2.  S/P Cesarean Delivery.  Indications: arrest of active phase of  labor                Principal Problem:   Postpartum care following cesarean delivery (7/4) Active Problems:   Hypothyroidism   Hypothyroid in pregnancy, antepartum   Cesarean delivery delivered  Doing well, stable.               Regular diet as tolerated D/C saline lock Remove pressure dressing in shower Ambulate 2-3 times in hallway today Routine post-op care Anticipate D/C home tomorrow  Raelyn MoraAWSON, Gyneth Hubka, M, MSN, CNM 05/02/2016, 7:15 AM

## 2016-05-03 LAB — TYPE AND SCREEN
ABO/RH(D): A NEG
Antibody Screen: POSITIVE
DAT, IGG: NEGATIVE
Unit division: 0
Unit division: 0

## 2016-05-03 MED ORDER — IBUPROFEN 600 MG PO TABS: 600.0000 mg | ORAL_TABLET | Freq: Four times a day (QID) | ORAL | Status: DC | PRN

## 2016-05-03 MED ORDER — OXYCODONE-ACETAMINOPHEN 5-325 MG PO TABS: 1.0000 | ORAL_TABLET | ORAL | Status: DC | PRN

## 2016-05-03 NOTE — Progress Notes (Signed)
POSTOPERATIVE DAY # 3 S/P cs   S:         Reports feeling ok             Tolerating po intake / no nausea / no vomiting / no flatus / no BM             Bleeding is light             Pain controlled with motrin and oxycodone             Up ad lib / ambulatory/ voiding QS  Newborn breast with formula feeding    O:  VS: BP 114/83 mmHg  Pulse 83  Temp(Src) 97.8 F (36.6 C) (Oral)  Resp 18  Ht 5\' 4"  (1.626 m)  Wt 126.554 kg (279 lb)  BMI 47.87 kg/m2  SpO2 99%  LMP 05/31/2015  Breastfeeding? Unknown   LABS:               Recent Labs  05/01/16 0607  WBC 17.6*  HGB 10.2*  PLT 165               Bloodtype: --/--/A NEG (07/05 16100607)  Rubella: Immune (12/06 0000)                                Physical Exam:             Alert and Oriented X3  Abdomen: soft, non-tender, non-distended active BS             Fundus: firm, non-tender, Ueven             Dressing intact honeycmob              Incision:  approximated with suture / no erythema / no ecchymosis / no drainage  Perineum: intact  Lochia: light  Extremities: trace edema, no calf pain or tenderness, negative Homans  A:        POD # 3 S/P CS              P:        Routine postoperative care              DC home     Marlinda MikeBAILEY, Adarius Tigges CNM, MSN, [facility]ACNM 05/03/2016, 11:22 AM

## 2016-05-03 NOTE — Lactation Note (Signed)
This note was copied from a baby's chart. Lactation Consultation Note: Mother states that she just finished feeding infant . She is using a #24 nipple shield with a feeding tube and syringe . Staff nurse reports that she was assisting mother with hand expression and unable to see colostrum. Assist mother with hand expression and observed large drops of colostrum from both breast. Mother was given lots of support and reasurance that she is likely to make milk.  Sat up an SNS with 15 ml. Infant opened mouth wide with a few sucks only. Infant back to sleep. Mother to pump for 15-20 mins. Discussion about supply and demand.  Mother advised to continue to hand express and post pump after each feeding. Mother has a Spectra 2 at home . Parents have lots of feeding tools, but do not seem overwhelmed.  Discussed that if infant became more jaundice and observed sleepiness to use a botte. Father states they have a tommy tipee bottle at hom[facility]. Advised them to feed infant every 2-3 hour and with feeding cues. [facility] for July 14 at 9 am.   Patient Name: Boy Ludwig LeanSarah Tat ZOXWR'UToday's Date: 05/03/2016     Maternal Data    Feeding Feeding Type: Breast Fed Length of feed: 3 min  LATCH Score/Interventions                      Lactation Tools Discussed/Used     Consult Status      Michel BickersKendrick, Garcia Dalzell McCoy 05/03/2016, 11:39 AM

## 2016-05-03 NOTE — Lactation Note (Signed)
This note was copied from a baby's chart. Lactation Consultation Note: [facility] Attempting to latch infant with nipple shield and feeding tube . Infant suckled a few times and then off to sleep. Several attempts with no latch achieved.  Infant was roused using a slow flow nipple on the botte. Infant took approx 15  ml. Parents were taught to pace bottle feed. Mother wanted to feed bottle . Infant taking feeding well.   Patient Name: Caroline Howe ZOXWR'UToday's Date: 05/03/2016 Reason for consult: Follow-up assessment   Maternal Data    Feeding Feeding Type: Formula Length of feed:  (only a few sucks. )  LATCH Score/Interventions                      Lactation Tools Discussed/Used     Consult Status Consult Status: Follow-up Date: 05/03/16 Follow-up type: In-patient    Stevan BornKendrick, Luvada Salamone [facility] 05/03/2016, 2:37 PM

## 2016-05-03 NOTE — Discharge Summary (Signed)
POSTOPERATIVE DISCHARGE SUMMARY:  Patient ID: Caroline Howe MRN: [id number] DOB/AGE: 04/19/1986 30 y.o.  Admit date: 04/29/2016 Admission Diagnoses: 40 weeks / hypothyroidism / maternal obesity  Discharge date:  05/03/2016 Discharge Diagnoses: POD 3 s/p cesarean section - arrest of labor/descent / mild ABL anemia  Prenatal history: G1P1001   EDC : 04/29/2016, Alternate EDD Entry  [facility]  Primary provider : Mody Prenatal course complicated by maternal obesity / hypothyroidism  Prenatal Labs: ABO, Rh: --/--/A NEG (07/05 [id number]) / Rhophylac given Antibody: POS (07/03 0815) Rubella: Immune (12/06 0000)   RPR: Non Reactive (07/03 0815)  HBsAg: Negative (12/06 0000)  HIV: Non-reactive (12/06 0000)  GBS: Negative (05/31 0000)   Medical / Surgical History :  Past medical history:  Past Medical History  Diagnosis Date  . Anxiety   . GERD (gastroesophageal reflux disease)   . Hypothyroidism   . Postpartum care following cesarean delivery (7/4) 05/01/2016    Past surgical history:  Past Surgical History  Procedure Laterality Date  . Wisdom tooth extraction    . Cesarean section N/A 04/30/2016    Procedure: CESAREAN SECTION;  Surgeon: Shea EvansVaishali Mody, MD;  [facility];  Service: Obstetrics;  Laterality: N/A;    Family History:  Family History  Problem Relation Age of Onset  . Diabetes Mother   . Hypertension Father   . Liver cancer Maternal Grandfather   . Colon cancer Paternal Grandfather     Social History:  reports that she has never smoked. She has never used smokeless tobacco. She reports that she drinks alcohol. She reports that she does not use illicit drugs.  Allergies: Review of patient's allergies indicates no known allergies.   Current Medications at time of admission:  Prior to Admission medications   Medication Sig Start Date End Date Taking? Authorizing Provider  calcium carbonate (TUMS - DOSED IN MG ELEMENTAL  CALCIUM) 500 MG chewable tablet Chew 1 tablet by mouth daily as needed for indigestion or heartburn.   Yes Historical Provider, MD  levothyroxine (SYNTHROID, LEVOTHROID) 112 MCG tablet Take 112 mcg by mouth daily before breakfast.   Yes Historical Provider, MD  loratadine (CLARITIN) 10 MG tablet Take 10 mg by mouth daily as needed for allergies.   Yes Historical Provider, MD  Prenatal Vit-Fe Fumarate-FA (MULTIVITAMIN-PRENATAL) 27-0.8 MG TABS tablet Take 1 tablet by mouth daily at 12 noon.   Yes Historical Provider, MD  ibuprofen (ADVIL,MOTRIN) 600 MG tablet Take 1 tablet (600 mg total) by mouth every 6 (six) hours as needed for mild pain. 05/03/16   Marlinda Mikeanya Bailey, CNM  oxyCODONE-acetaminophen (PERCOCET/ROXICET) 5-325 MG tablet Take 1 tablet by mouth every 4 (four) hours as needed (pain scale 4-7). 05/03/16   Marlinda Mikeanya Bailey, CNM   Intrapartum Course:  Admit for induction labor with lack of labor progression or fetal engagement after 36 hours labor Pain management: epidural Complicated by: arrest of labor / descent Interventions required: CS  Procedures: Cesarean section delivery on 7/4 with delivery of viable newborn by Dr Juliene PinaMody   See operative report for further details APGAR (1 MIN): 7   APGAR (5 MINS): 9    Postoperative / postpartum course:  Uncomplicated with discharge on POD 3   Discharge Instructions:  Discharged Condition: stable  Activity: pelvic rest and postoperative restrictions x 2   Diet: routine  Medications:    Medication List    TAKE these medications        calcium carbonate 500 MG chewable  tablet  Commonly known as:  TUMS - dosed in mg elemental calcium  Chew 1 tablet by mouth daily as needed for indigestion or heartburn.     ibuprofen 600 MG tablet  Commonly known as:  ADVIL,MOTRIN  Take 1 tablet (600 mg total) by mouth every 6 (six) hours as needed for mild pain.     levothyroxine 112 MCG tablet  Commonly known as:  SYNTHROID, LEVOTHROID  Take 112 mcg by  mouth daily before breakfast.     loratadine 10 MG tablet  Commonly known as:  CLARITIN  Take 10 mg by mouth daily as needed for allergies.     multivitamin-prenatal 27-0.8 MG Tabs tablet  Take 1 tablet by mouth daily at 12 noon.     oxyCODONE-acetaminophen 5-325 MG tablet  Commonly known as:  PERCOCET/ROXICET  Take 1 tablet by mouth every 4 (four) hours as needed (pain scale 4-7).        Wound Care: keep clean and dry / remove honeycomb POD 5 Postpartum Instructions: [facility] discharge booklet - instructions reviewed  Discharge to: Home  Follow up :   [facility] in 6 weeks for routine postpartum visit with Dr mody                Signed: Marlinda MikeBAILEY, TANYA CNM, MSN, [facility] 05/03/2016, 11:24 AM

## 2016-05-04 ENCOUNTER — Ambulatory Visit: Payer: Self-pay

## 2016-05-04 NOTE — Lactation Note (Signed)
This note was copied from a baby's chart. Lactation Consultation Note Mom unable to latch baby to breast, #1 reason is baby almost refuses to latch, even before it ever received a bottle for supplementing. Baby has high palate and limited tongue movement. Previous LC felt that #24 NS was needed d/t high palate. Moms nipples are at the bottom of her breast, can be difficulty at times to keep NS on at times, and using 5 Fr. Tube, now has SNS to take home and use. Encouraged mom to pump every three hours to stimulate lactation. Encouraged STS, strict I&O. BF w/SNS, if baby refuses to BF and latch, then to bottle feed to insure nutrition. Parents has follow up 05/10/2016. LC feels that BF will go better once milk comes in.  Patient Name: Caroline Howe LeanSarah Plocher ZOXWR'UToday's Date: 05/04/2016 Reason for consult: Follow-up assessment;Difficult latch   Maternal Data    Feeding Feeding Type: Formula Nipple Type: Slow - flow  LATCH Score/Interventions       Type of Nipple: Flat Intervention(s): Double electric pump              Lactation Tools Discussed/Used     Consult Status Consult Status: Complete Date: 05/04/16 Follow-up type: Out-patient    Zoe Nordin, Diamond NickelLAURA G 05/04/2016, 10:07 AM

## 2016-05-05 ENCOUNTER — Ambulatory Visit: Payer: Self-pay

## 2016-05-05 NOTE — Lactation Note (Signed)
This note was copied from a baby's chart. Lactation Consultation Note  Patient Name: Caroline Ludwig LeanSarah Faulks ZOXWR'UToday's Date: 05/05/2016   Follow up on day of discharge, baby 745 days old.  Mom feeding baby formula by bottle and pumping.  Mom has a DEBP at home Chiropractor(Spectra).  Mom encouraged to place baby skin to skin and feed often when he cues he is hungry.  To pump at each feeding (15-20 minutes).  Engorgement prevention and treatment discussed.  Mom still only getting drops when she pumps.  Denies any breast changes with pregnancy.  Takes Synthroid for hypothyroidism.  No other risk factors for milk supply issues identified.  Mom has an OP Lactation appointment scheduled for 7/15.  Encouraged her to call prn prior to appointment.     Judee ClaraSmith, Brennon Otterness E 05/05/2016, 11:44 AM

## 2016-05-10 ENCOUNTER — Ambulatory Visit (HOSPITAL_COMMUNITY)
Admit: 2016-05-10 | Discharge: 2016-05-10 | Disposition: A | Discharge status: Home / Self Care | Payer: BC Managed Care – PPO | Attending: Obstetrics & Gynecology | Admitting: Obstetrics & Gynecology

## 2016-05-10 NOTE — Lactation Note (Addendum)
Lactation Consult  Mother's reason for visit:  Difficulty sustaining latch Visit Type:  Outpatient Appointment Notes:  Mother w/ history of hypothyroidism.  Mother has had difficulty sustaining latch and low milk supply. Mother has pendulous breast and is using #24NS to latch.  Reviewed hand expression and mother easily expresses flow of milk.  Mother has been attempting to breastfeed once a day with NS and baby latches a few minutes and comes off.  She then gives baby any pumped breastmilk (14 ml - 30 ml) and then gives an additional 2-3 ounces of formula in slow flow bottle.  Baby has not returned to birth weight at 64 days old.  During consult baby latched without NS briefly but was sleepy at the breast.  Baby received 1 oz of formula before consult.  Latched with NS and applied 5 french tubing  under #24NS and baby took 26 ml and 4 ml of breastmilk during feeding on both breast.  Consider baby is used to flow of bottles and with mother's low milk milk supply does not sustain latch.   Plan Hand express before feeding and latching. Put baby to breast on demand.  Do most of feeding at the breast with 5 french feeding tube with or without NS.  Post pump at least 6 times a day for 10-20 min.  Give baby volume back at next feeding before offering formula. Continue working to latch without NS compressing/shaping breast and holding until baby is in pattern. Suggest fenugreek - 3 capsules 3 times a day. Provided informational sheet Gave instructions on power pumping session to boost milk. Continue to supplement with breastmilk and 2-3 ounces of formula increasing as baby demands. Recommend follow up OP appointment.  Suggest mother contact her MD to have thyroid levels checked.   Consult:  Initial Lactation Consultant:  Caroline Howe  ________________________________________________________________________ Caroline Howe Name: Caroline Howe Date of Birth: 04/30/2015 Pediatrician:  Caroline Howe Gender: female Gestational Age: [redacted]w[redacted]d (At Birth) Birth Weight: 8 lb 13.1 oz (4000 g) Weight at Discharge: Weight: 8 lb 6.9 oz (3825 g)Date of Discharge: 05/05/2016 Filed Weights   05/02/16 2350 05/04/16 0120 05/05/16 0100  Weight: 8 lb 3 oz (3715 g) 8 lb 4.8 oz (3765 g) 8 lb 6.9 oz (3825 g)   [facility] lb 7 [facility] Location:[facility] Weight today: 8 lb 9.7 oz    _______________________________________________________________________  Mother's Name: Caroline Howe Type of delivery:   Breastfeeding Experience:  Primip Maternal Medical Conditions:  Thyroid Maternal Medications:  Synthroid 112  ________________________________________________________________________  Breastfeeding History (Post Discharge)  Frequency of breastfeeding:  Once per day Duration of feeding:  1-2 min  Supplementation  Formula:  Volume 120-160 ml Frequency:  8 times per day Total volume per day:        Brand: Similac Alimentum  Breastmilk:  Volume 14-30 ml Frequency:  4x Total volume per day: 56-171ml  Method:  Bottle,   Pumping  Type of pump:  Spectra Frequency:  4 Volume:  56-1108ml per day  Infant Intake and Output Assessment  Voids:  7-8 in 24 hrs.  Color:  Clear yellow Stools:  1-2 in 24 hrs.   ________________________________________________________________________  Maternal Breast Assessment  Breast:  Soft Nipple:  Erect Pain level:  0 Pain interventions:  Breast pump and Nipple shield  _______________________________________________________________________ Feeding Assessment/Evaluation  Initial feeding assessment:  Infant's oral assessment:  WNL  Positioning:  Cross cradle Right breast  LATCH documentation:  Latch:  1 = Repeated attempts needed to sustain latch, nipple held in mouth throughout feeding, stimulation needed to elicit sucking reflex.  Audible swallowing:  1 = A few with  stimulation  Type of nipple:  2 = Everted at rest and after stimulation  Comfort (Breast/Nipple):  2 = Soft / non-tender  Hold (Positioning):  1 = Assistance needed to correctly position infant at breast and maintain latch  LATCH score:  7  Attached assessment:  Shallow  Lips flanged:  Yes.    Lips untucked:  Yes.    Suck assessment:  Displays both  Tools:  Nipple shield 24 mm, Curved tip syringe, Syringe with 5 Fr feeding tube and Pump Instructed on use and cleaning of tool:  Yes.    Pre-feed weight:  3904 g   Post-feed weight:  3926 g  Amount transferred:  4 ml Amount supplemented:  18 ml  Additional Feeding Assessment -   Infant's oral assessment:  WNL  Positioning:  Football Left breast  LATCH documentation:  Latch:  1 = Repeated attempts needed to sustain latch, nipple held in mouth throughout feeding, stimulation needed to elicit sucking reflex.  Audible swallowing:  1 = A few with stimulation  Type of nipple:  2 = Everted at rest and after stimulation  Comfort (Breast/Nipple):  2 = Soft / non-tender  Hold (Positioning):  1 = Assistance needed to correctly position infant at breast and maintain latch  LATCH score:  7  Attached assessment:  Shallow  Lips flanged:  Yes.    Lips untucked:  Yes.    Suck assessment:  Displays both  Tools:  Nipple shield 24 mm, Curved tip syringe, Syringe with 5 Fr feeding tube and Pump Instructed on use and cleaning of tool:  Yes.    Pre-feed weight:  3926 g  Post-feed weight:  3934 g Amount transferred:  0 ml Amount supplemented:  8 ml   Total amount pumped post feed:  R 4ml    L 0 ml  Total amount transferred:  4 ml Total supplement given:  28 ml plus additional 17 ml in bottle and baby fell asleep.

## 2016-11-07 ENCOUNTER — Ambulatory Visit (INDEPENDENT_AMBULATORY_CARE_PROVIDER_SITE_OTHER): Payer: BC Managed Care – PPO | Admitting: Emergency Medicine

## 2016-11-07 DIAGNOSIS — S1091XA Abrasion of unspecified part of neck, initial encounter: Secondary | ICD-10-CM | POA: Diagnosis not present

## 2016-11-07 DIAGNOSIS — G44209 Tension-type headache, unspecified, not intractable: Secondary | ICD-10-CM | POA: Diagnosis not present

## 2016-11-07 DIAGNOSIS — M542 Cervicalgia: Secondary | ICD-10-CM | POA: Diagnosis not present
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
## 2017-05-01 ENCOUNTER — Ambulatory Visit (INDEPENDENT_AMBULATORY_CARE_PROVIDER_SITE_OTHER): Payer: BC Managed Care – PPO | Admitting: Family Medicine

## 2017-05-01 ENCOUNTER — Encounter: Payer: Self-pay | Admitting: Family Medicine

## 2017-05-01 VITALS — BP 128/88 | HR 105 | Temp 98.6°F | Resp 16 | Ht 64.5 in | Wt 277.6 lb

## 2017-05-01 DIAGNOSIS — H9203 Otalgia, bilateral: Secondary | ICD-10-CM

## 2017-05-01 DIAGNOSIS — J029 Acute pharyngitis, unspecified: Secondary | ICD-10-CM

## 2017-05-01 LAB — POCT RAPID STREP A (OFFICE): Rapid Strep A Screen: NEGATIVE

## 2017-05-01 NOTE — Patient Instructions (Addendum)
Drink plenty of fluids and get enough rest  Good handwashing and stay out of people's faces as much is possible  Take Tylenol 500 mg 2 pills 3 times daily and or ibuprofen 200 mg 3 pills 3 times daily as needed for fever or aching or sore throat or generalized malaise.  Use lozenges if needed for sore throat  The culture will take 2 or 3 more days to show whether it grows anything. I will try and let you know the results of that as soon as I see it. If you're getting worse rather than better call to see if they can check on the results.  Return as needed    IF you received an x-ray today, you will receive an invoice from [facility]. Please contact [facility] at [phone] with questions or concerns regarding your invoice.   IF you received labwork today, you will receive an invoice from [facility]. Please contact [facility] at [phone] with questions or concerns regarding your invoice.   Our billing staff will not be able to assist you with questions regarding bills from these companies.  You will be contacted with the lab results as soon as they are available. The fastest way to get your results is to activate your My Chart account. Instructions are located on the last page of this paperwork. If you have not heard from us regarding the results in 2 weeks, please contact this office.

## 2017-05-01 NOTE — Progress Notes (Signed)
Patient ID: Ludwig LeanSarah Walberg, female    DOB: November 19, 1985  Age: 31 y.o. MRN: [id number]  Chief Complaint  Patient presents with  . Sore Throat    Began yesterday  . Ear Pain    Bilateral     Subjective:   31 year old lady who has a 30-year-old child. The child is been sick with hand-foot-and-mouth. The mother awakened with a 100.1 fever night before last. Yesterday she felt bad and had a sore throat which bothered her last night. Her spouse is not ill. 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 She did hurt in both ears also.  Current allergies, medications, problem list, past/family and social histories reviewed.  Objective:  BP 128/88   Pulse (!) 105   Temp 98.6 F (37 C) (Oral)   Resp 16   Ht 5' 4.5" (1.638 m)   Wt 277 lb 9.6 oz (125.9 kg)   SpO2 97%   BMI 46.91 kg/m   No major distress. Alert and oriented. TMs are normal. Throat erythematous primarily down the posterior pharynx with some edema of the tissues. The tonsils some cells do not appear as red and do not have any pus visible. Neck supple with only small nodes. Chest is clear to auscultation. Heart regular.  Assessment & Plan:   Assessment: 1. Sore throat   2. Otalgia of both ears       Plan: Will do the rapid strep and send off culture if necessary and decide treatment after I see that.  Orders Placed This Encounter  Procedures  . Culture, Group A Strep    Order Specific Question:   Source    Answer:   thraot  . POCT rapid strep A    Meds ordered this encounter  Medications  . norgestimate-ethinyl estradiol (ORTHO-CYCLEN,SPRINTEC,PREVIFEM) 0.25-35 MG-MCG tablet    Sig: Take 1 tablet by mouth daily.     Results for orders placed or performed in visit on 05/01/17  POCT rapid strep A  Result Value Ref Range   Rapid Strep A Screen Negative Negative       Patient Instructions   Drink plenty of fluids and get enough rest  Good handwashing and stay out of people's faces as much is  possible  Take Tylenol 500 mg 2 pills 3 times daily and or ibuprofen 200 mg 3 pills 3 times daily as needed for fever or aching or sore throat or generalized malaise.  Use lozenges if needed for sore throat  The culture will take 2 or 3 more days to show whether it grows anything. I will try and let you know the results of that as soon as I see it. If you're getting worse rather than better call to see if they can check on the results.  Return as needed    IF you received an x-ray today, you will receive an invoice from [facility]. Please contact [facility] at [phone] with questions or concerns regarding your invoice.   IF you received labwork today, you will receive an invoice from [facility]. Please contact [facility] at [phone] with questions or concerns regarding your invoice.   Our billing staff will not be able to assist you with questions regarding bills from these companies.  You will be contacted with the lab results as soon as they are available. The fastest way to get your results is to activate your My Chart account. Instructions are located on the last page of this paperwork. If you have not heard  from [place] regarding the results in 2 weeks, please contact this office.         Return if symptoms worsen or fail to improve.   HOPPER,DAVID, MD 05/01/2017

## 2017-05-04 LAB — CULTURE, GROUP A STREP: STREP A CULTURE: NEGATIVE

## 2017-05-07 ENCOUNTER — Ambulatory Visit (INDEPENDENT_AMBULATORY_CARE_PROVIDER_SITE_OTHER): Payer: BC Managed Care – PPO | Admitting: Family Medicine

## 2017-05-07 ENCOUNTER — Encounter: Payer: Self-pay | Admitting: Family Medicine

## 2017-05-07 VITALS — BP 121/80 | HR 85 | Temp 98.0°F | Resp 18 | Ht 64.5 in | Wt 282.0 lb

## 2017-05-07 DIAGNOSIS — H9201 Otalgia, right ear: Secondary | ICD-10-CM | POA: Diagnosis not present

## 2017-05-07 DIAGNOSIS — J329 Chronic sinusitis, unspecified: Secondary | ICD-10-CM | POA: Diagnosis not present

## 2017-05-07 DIAGNOSIS — H6691 Otitis media, unspecified, right ear: Secondary | ICD-10-CM

## 2017-05-07 DIAGNOSIS — R05 Cough: Secondary | ICD-10-CM | POA: Diagnosis not present

## 2017-05-07 DIAGNOSIS — R059 Cough, unspecified: Secondary | ICD-10-CM

## 2017-05-07 DIAGNOSIS — J029 Acute pharyngitis, unspecified: Secondary | ICD-10-CM | POA: Diagnosis not present

## 2017-05-07 MED ORDER — BENZONATATE 100 MG PO CAPS: 100.0000 mg | ORAL_CAPSULE | Freq: Three times a day (TID) | ORAL | 0 refills | Status: DC | PRN

## 2017-05-07 MED ORDER — FLUTICASONE PROPIONATE 50 MCG/ACT NA SUSP: 2.0000 | Freq: Every day | NASAL | 0 refills | Status: DC

## 2017-05-07 MED ORDER — HYDROCODONE-HOMATROPINE 5-1.5 MG/5ML PO SYRP: ORAL_SOLUTION | ORAL | 0 refills | Status: DC

## 2017-05-07 MED ORDER — AMOXICILLIN-POT CLAVULANATE 875-125 MG PO TABS: 1.0000 | ORAL_TABLET | Freq: Two times a day (BID) | ORAL | 0 refills | Status: DC

## 2017-05-07 NOTE — Progress Notes (Signed)
Patient ID: Caroline Howe, female    DOB: 22-Jun-1986  Age: 31 y.o. MRN: [id number]  Chief Complaint  Patient presents with  . Sore Throat    follow up  . Ear Pain    follow up     Subjective:   Patient was seen by me last week 6 days ago with a pharyngitis, nonstreptococcal. She persisted with sore throat and has right ear pain. Her husband also is been a little ill. She has had some postnasal drainage and coughing. She thinks it's her sinuses.  Current allergies, medications, problem list, past/family and social histories reviewed.  Objective:  BP 121/80   Pulse 85   Temp 98 F (36.7 C) (Oral)   Resp 18   Ht 5' 4.5" (1.638 m)   Wt 282 lb (127.9 kg)   SpO2 98%   BMI 47.66 kg/m   No major distress. Left TM is normal. Tiny bit of wax on the floor of the left canal but the drum is well-visualized. Right TM has normal landmarks but is slightly erythematous, differently so compared to the left. Her throat has erythema down the tonsillar pillars and a little postnasal drainage, yellowish in appearance, the uvula. Neck supple without significant nodes. Chest is clear to auscultation. No wheezing noted. Heart regular without murmur.  Assessment & Plan:   Assessment: 1. Chronic sinusitis, unspecified location   2. Otalgia of right ear   3. Right otitis media, unspecified otitis media type   4. Cough   5. Acute pharyngitis, unspecified etiology       Plan:   No orders of the defined types were placed in this encounter.   Meds ordered this encounter  Medications  . HYDROcodone-homatropine (HYCODAN) 5-1.5 MG/5ML syrup    Sig: Take 1 teaspoon every 4-6 hours as needed for nighttime cough.    Dispense:  120 mL    Refill:  0  . benzonatate (TESSALON) 100 MG capsule    Sig: Take 1-2 capsules (100-200 mg total) by mouth 3 (three) times daily as needed.    Dispense:  30 capsule    Refill:  0  . amoxicillin-clavulanate (AUGMENTIN) 875-125 MG tablet    Sig: Take 1 tablet by mouth  2 (two) times daily.    Dispense:  14 tablet    Refill:  0  . fluticasone (FLONASE) 50 MCG/ACT nasal spray    Sig: Place 2 sprays into both nostrils daily.    Dispense:  16 g    Refill:  0         Patient Instructions   Drink plenty of fluids and get enough rest  Take the cough pills one or 2 pills every 6 or 8 hours 3 times in the daytime as needed for cough.  Take cough syrup 1 teaspoon every 4-6 hours as needed at nighttime. Will cause some sedation.  Take the Augmentin (amoxicillin/clavulanate) one twice daily for 1 full week for infection  Use the fluticasone nose spray 2 sprays each nostril twice daily, then decrease to once daily after about 4 or 5 days  Return if worse or not improving     IF you received an x-ray today, you will receive an invoice from [facility]. Please contact [facility] at [phone] with questions or concerns regarding your invoice.   IF you received labwork today, you will receive an invoice from [facility]. Please contact [facility] at [phone] with questions or concerns regarding your invoice.   Our billing staff will not be able  to assist you with questions regarding bills from these companies.  You will be contacted with the lab results as soon as they are available. The fastest way to get your results is to activate your My Chart account. Instructions are located on the last page of this paperwork. If you have not heard from [place] regarding the results in 2 weeks, please contact this office.        No Follow-up on file.   Laia Wiley, MD 05/07/2017

## 2017-05-07 NOTE — Patient Instructions (Addendum)
Drink plenty of fluids and get enough rest  Take the cough pills one or 2 pills every 6 or 8 hours 3 times in the daytime as needed for cough.  Take cough syrup 1 teaspoon every 4-6 hours as needed at nighttime. Will cause some sedation.  Take the Augmentin (amoxicillin/clavulanate) one twice daily for 1 full week for infection  Use the fluticasone nose spray 2 sprays each nostril twice daily, then decrease to once daily after about 4 or 5 days  Return if worse or not improving     IF you received an x-ray today, you will receive an invoice from [facility]. Please contact [facility] at [phone] with questions or concerns regarding your invoice.   IF you received labwork today, you will receive an invoice from [facility]. Please contact [facility] at [phone] with questions or concerns regarding your invoice.   Our billing staff will not be able to assist you with questions regarding bills from these companies.  You will be contacted with the lab results as soon as they are available. The fastest way to get your results is to activate your My Chart account. Instructions are located on the last page of this paperwork. If you have not heard from us regarding the results in 2 weeks, please contact this office.

## 2017-05-13 IMAGING — US US OB COMP LESS 14 WK
1 series · 15 of 28 positions shown · non-contrast
Comparison: None.

CLINICAL DATA: 29-year-old pregnant female with vaginal bleeding.

EXAM:
OBSTETRIC <14 WK US AND TRANSVAGINAL OB US
TECHNIQUE: Both transabdominal and transvaginal ultrasound examinations were
performed for complete evaluation of the gestation as well as the
maternal uterus, adnexal regions, and pelvic cul-de-sac.
Transvaginal technique was performed to assess early pregnancy.

[Series 1: us ob comp less 14 wk · 15 of 46 slices shown]
[im 1/46]
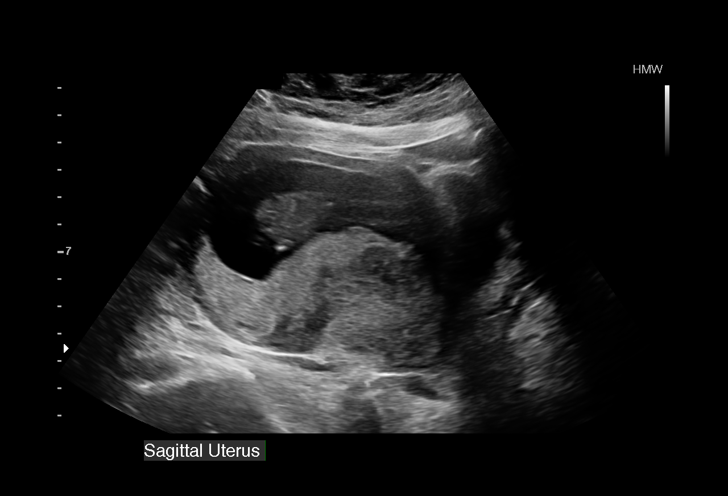
[im 4/46]
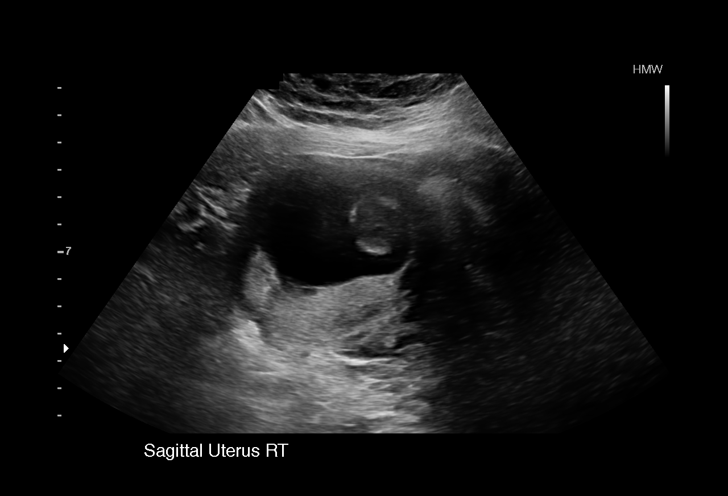
[im 7/46]
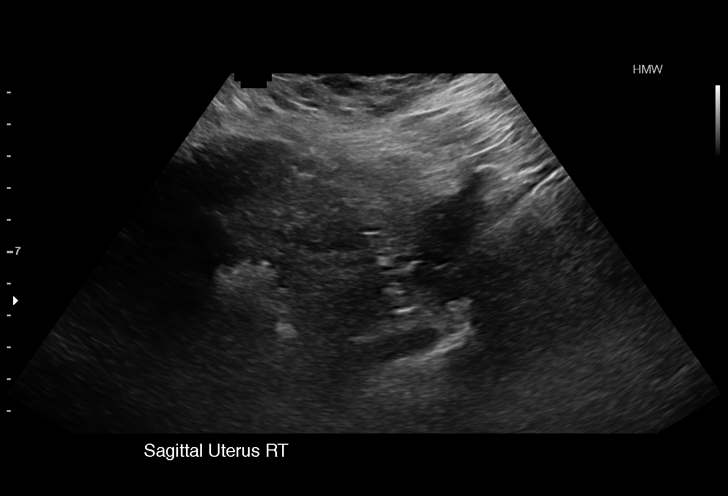
[im 11/46]
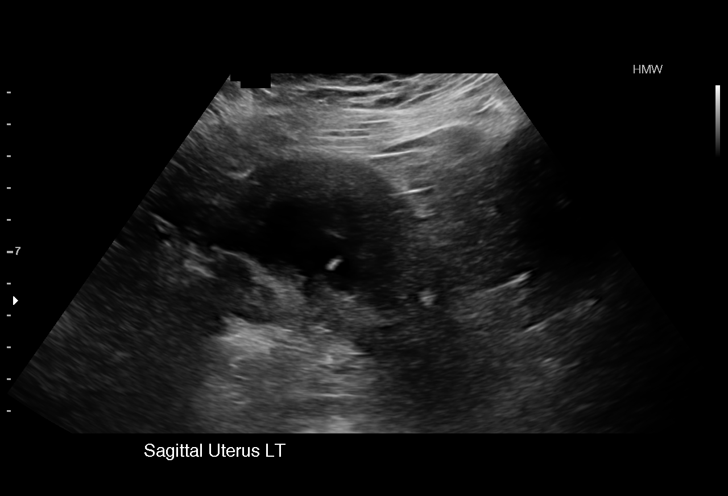
[im 14/46]
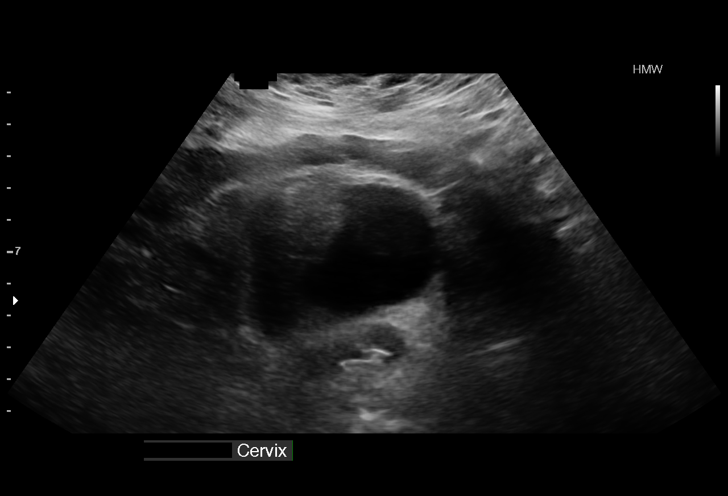
[im 17/46]
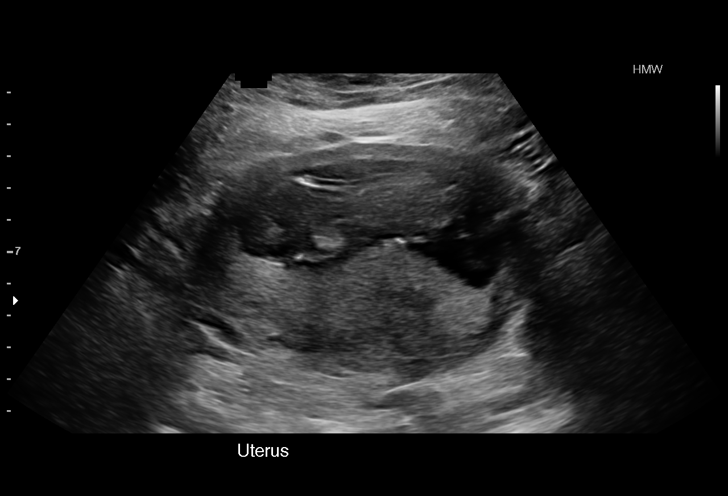
[im 21/46]
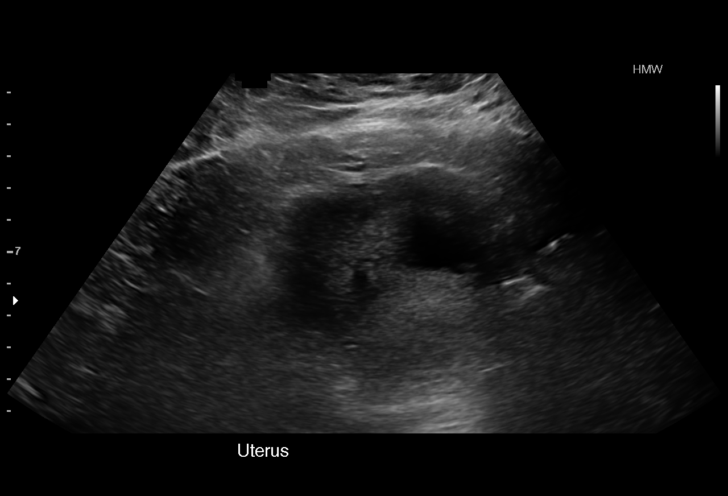
[im 24/46]
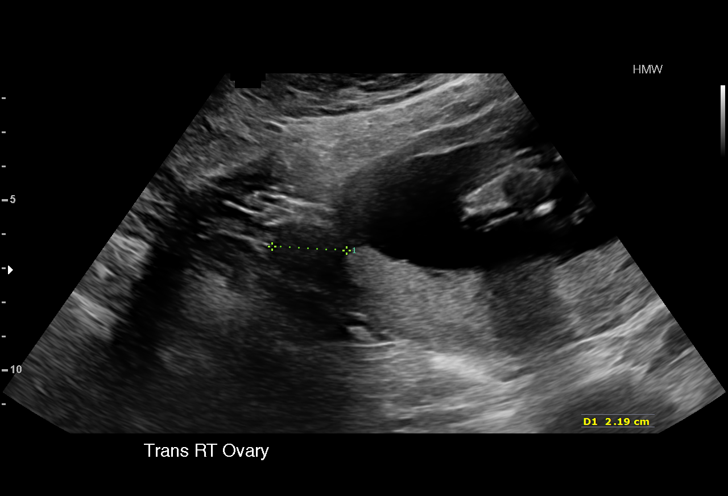
[im 26/46]
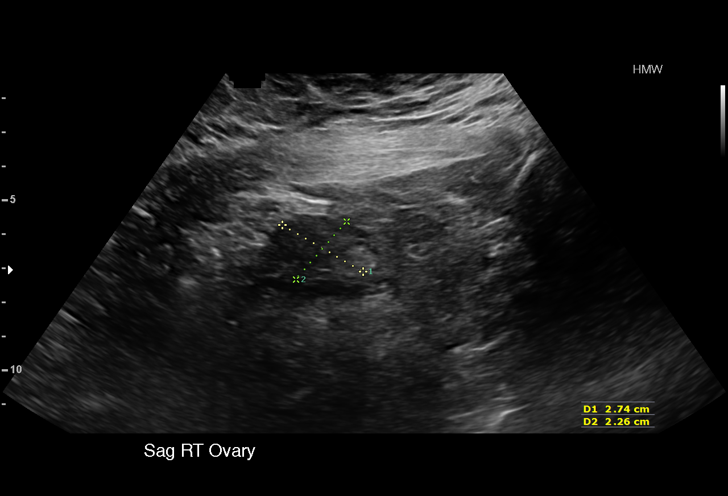
[im 29/46]
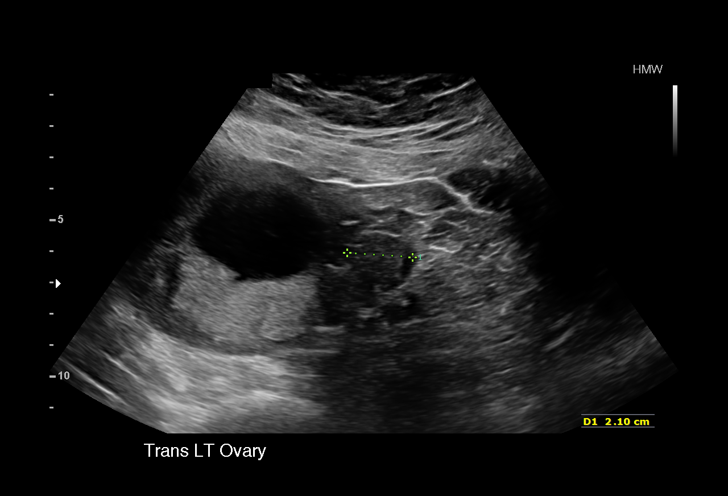
[im 32/46]
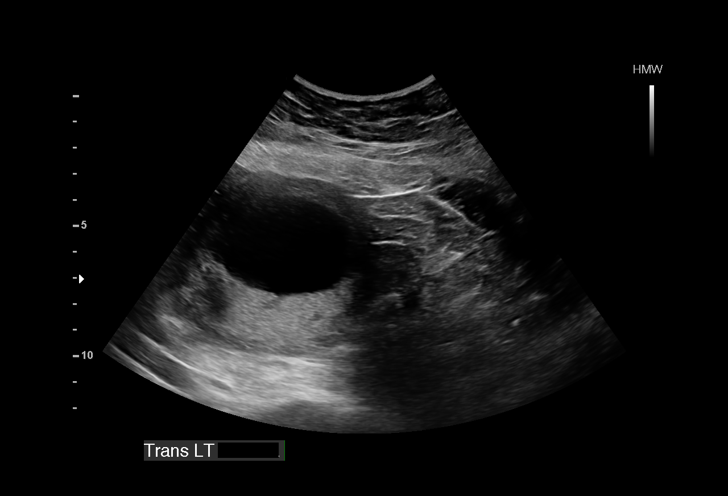
[im 36/46]
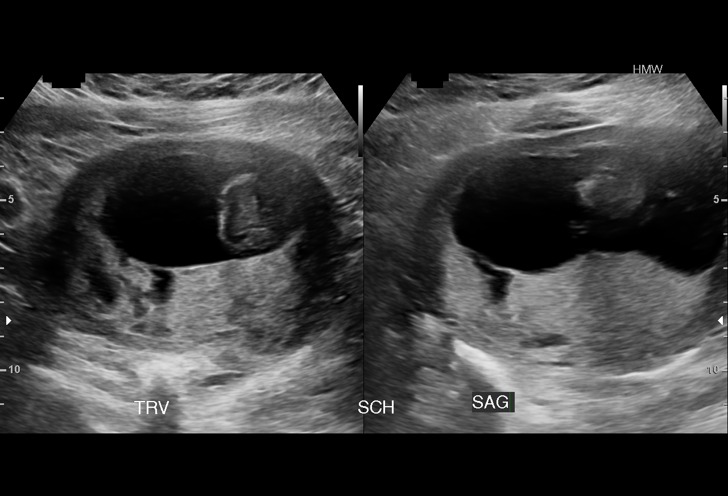
[im 39/46]
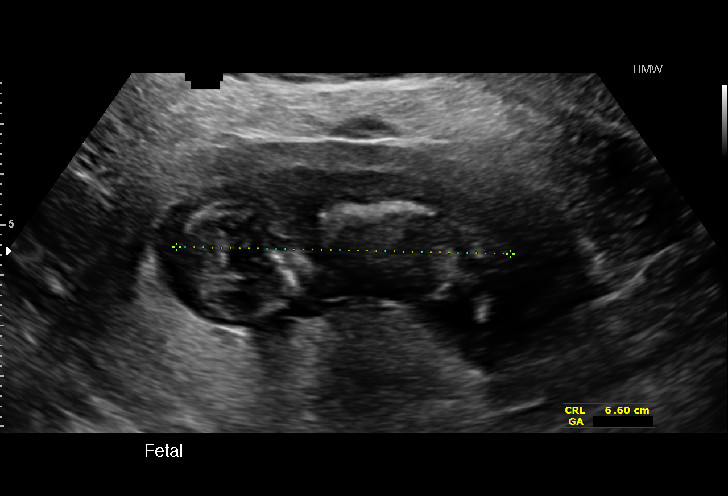
[im 42/46]
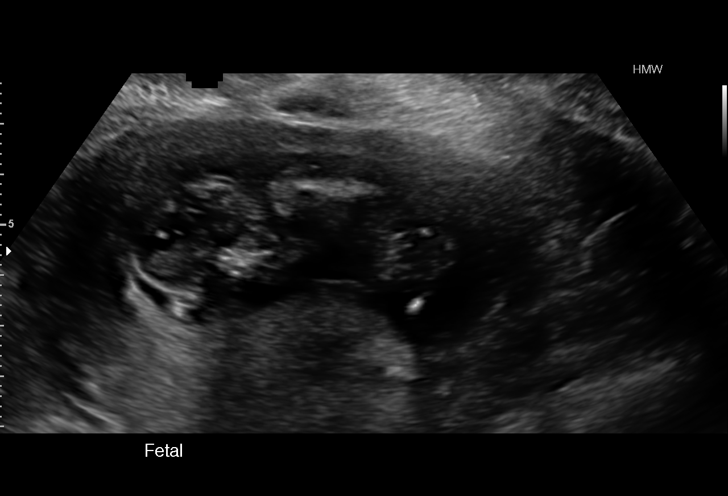
[im 46/46]
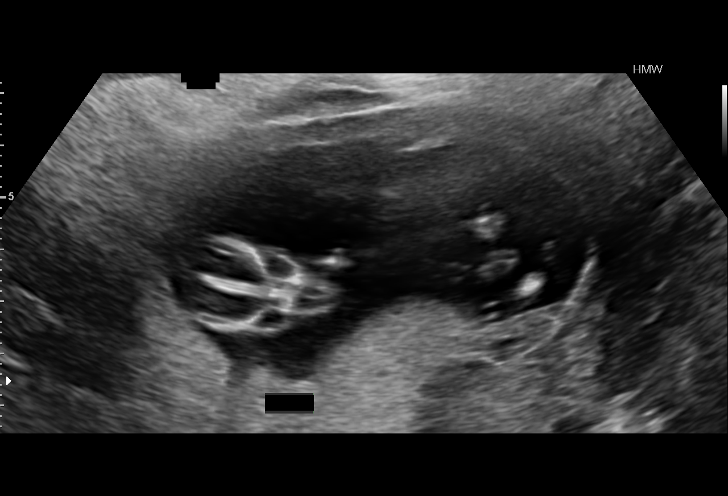

[15 of 28 positions shown; findings below may reference images not displayed]

FINDINGS: Intrauterine gestational sac: Single intrauterine gestational sac

Yolk sac:  Not visualized

Embryo:  Present

Cardiac Activity: Detected

Heart Rate: 158  bpm

CRL:  65  mm   12 w   6 d                  US EDC: 04/26/2016

Subchorionic hemorrhage:  Small

Maternal uterus/adnexae: The maternal ovaries appear unremarkable.
No significant free fluid within the pelvis.
IMPRESSION: Single live intrauterine pregnancy with an estimated gestational age
of 12 weeks, 6 days. Small subchorionic hemorrhage. Follow-up
recommended.

## 2017-06-09 ENCOUNTER — Encounter: Payer: Self-pay | Admitting: Family Medicine

## 2017-06-09 ENCOUNTER — Ambulatory Visit (INDEPENDENT_AMBULATORY_CARE_PROVIDER_SITE_OTHER): Payer: BC Managed Care – PPO | Admitting: Family Medicine

## 2017-06-09 VITALS — BP 138/86 | HR 120 | Temp 100.0°F | Resp 16 | Ht 64.57 in | Wt 278.0 lb

## 2017-06-09 DIAGNOSIS — R5081 Fever presenting with conditions classified elsewhere: Secondary | ICD-10-CM | POA: Diagnosis not present

## 2017-06-09 DIAGNOSIS — H9203 Otalgia, bilateral: Secondary | ICD-10-CM

## 2017-06-09 DIAGNOSIS — J029 Acute pharyngitis, unspecified: Secondary | ICD-10-CM | POA: Diagnosis not present

## 2017-06-09 DIAGNOSIS — J020 Streptococcal pharyngitis: Secondary | ICD-10-CM | POA: Diagnosis not present

## 2017-06-09 LAB — POCT RAPID STREP A (OFFICE): Rapid Strep A Screen: POSITIVE — AB

## 2017-06-09 MED ORDER — PENICILLIN V POTASSIUM 500 MG PO TABS: 500.0000 mg | ORAL_TABLET | Freq: Three times a day (TID) | ORAL | 0 refills | Status: DC

## 2017-06-09 NOTE — Patient Instructions (Addendum)
You do have strep throat. This will be painful for several days.   Take ibuprofen 800 mg (200 mg 4) 3 times daily, and if that does not give sufficient pain relief you can also take Tylenol (acetaminophen) extra strength 500 mg 2 pills 3 times daily.  Use lozenges and/or gargles as needed for discomfort.  Take penicillin 500 mg 1 pill 3 times daily for the full 10 days.  Stay out of the face of your family. If they get sick they should come in promptly for treatment.  Return if problems   Strep Throat Strep throat is a bacterial infection of the throat. Your health care provider may call the infection tonsillitis or pharyngitis, depending on whether there is swelling in the tonsils or at the back of the throat. Strep throat is most common during the cold months of the year in children who are 62-28 years of age, but it can happen during any season in people of any age. This infection is spread from person to person (contagious) through coughing, sneezing, or close contact. What are the causes? Strep throat is caused by the bacteria called Streptococcus pyogenes. What increases the risk? This condition is more likely to develop in:  People who spend time in crowded places where the infection can spread easily.  People who have close contact with someone who has strep throat.  What are the signs or symptoms? Symptoms of this condition include:  Fever or chills.  Redness, swelling, or pain in the tonsils or throat.  Pain or difficulty when swallowing.  White or yellow spots on the tonsils or throat.  Swollen, tender glands in the neck or under the jaw.  Red rash all over the body (rare).  How is this diagnosed? This condition is diagnosed by performing a rapid strep test or by taking a swab of your throat (throat culture test). Results from a rapid strep test are usually ready in a few minutes, but throat culture test results are available after one or two days. How is this  treated? This condition is treated with antibiotic medicine. Follow these instructions at home: Medicines  Take over-the-counter and prescription medicines only as told by your health care provider.  Take your antibiotic as told by your health care provider. Do not stop taking the antibiotic even if you start to feel better.  Have family members who also have a sore throat or fever tested for strep throat. They may need antibiotics if they have the strep infection. Eating and drinking  Do not share food, drinking cups, or personal items that could cause the infection to spread to other people.  If swallowing is difficult, try eating soft foods until your sore throat feels better.  Drink enough fluid to keep your urine clear or pale yellow. General instructions  Gargle with a salt-water mixture 3-4 times per day or as needed. To make a salt-water mixture, completely dissolve -1 tsp of salt in 1 cup of warm water.  Make sure that all household members wash their hands well.  Get plenty of rest.  Stay home from school or work until you have been taking antibiotics for 24 hours.  Keep all follow-up visits as told by your health care provider. This is important. Contact a health care provider if:  The glands in your neck continue to get bigger.  You develop a rash, cough, or earache.  You cough up a thick liquid that is green, yellow-brown, or bloody.  You have pain or  discomfort that does not get better with medicine.  Your problems seem to be getting worse rather than better.  You have a fever. Get help right away if:  You have new symptoms, such as vomiting, severe headache, stiff or painful neck, chest pain, or shortness of breath.  You have severe throat pain, drooling, or changes in your voice.  You have swelling of the neck, or the skin on the neck becomes red and tender.  You have signs of dehydration, such as fatigue, dry mouth, and decreased urination.  You  become increasingly sleepy, or you cannot wake up completely.  Your joints become red or painful. This information is not intended to replace advice given to you by your health care provider. Make sure you discuss any questions you have with your health care provider. Document Released: 10/11/2000 Document Revised: 06/12/2016 Document Reviewed: 02/06/2015 Elsevier Interactive Patient Education  2017 [employer]    IF you received an x-ray today, you will receive an invoice from [facility]. Please contact [facility] at [phone] with questions or concerns regarding your invoice.   IF you received labwork today, you will receive an invoice from [facility]. Please contact [facility] at [phone] with questions or concerns regarding your invoice.   Our billing staff will not be able to assist you with questions regarding bills from these companies.  You will be contacted with the lab results as soon as they are available. The fastest way to get your results is to activate your My Chart account. Instructions are located on the last page of this paperwork. If you have not heard from us regarding the results in 2 weeks, please contact this office.

## 2017-06-09 NOTE — Progress Notes (Signed)
Patient ID: Caroline LeanSarah Howe, female    DOB: 03-23-1986  Age: 31 y.o. MRN: [id number]  Chief Complaint  Patient presents with  . Ear Pain    x 2 days with some fever     Subjective:   Caroline Howe 31 year old young lady who I have seen twice earlier this summer with respiratory sinus infections. She was doing better. Yesterday she got a sore throat and fever and generalized malaise. She is nauseous and threw up once. Her ears have been hurting her. She has not had much out of her nose or sinus drainage. Not coughing much. She has had a husband, but they're not ill. 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  Current allergies, medications, problem list, past/family and social histories reviewed.  Objective:  BP 138/86   Pulse (!) 120   Temp 100 F (37.8 C) (Oral)   Resp 16   Ht 5' 4.57" (1.64 m)   Wt 278 lb (126.1 kg)   LMP 05/19/2017 (Approximate)   SpO2 97%   BMI 46.88 kg/m   She looks ill. Her TMs are normal. Throat erythematous with exudate. Chest clear. Heart regular without murmur. Her neck has tenderness of the anterior cervical nodes which are mildly enlarged.  Assessment & Plan:   Assessment: 1. Streptococcal pharyngitis   2. Sore throat   3. Fever in other diseases   4. Otalgia of both ears       Plan: Check her strep test and then decide what we do with her treatment.  Orders Placed This Encounter  Procedures  . Culture, Group A Strep    Order Specific Question:   Source    Answer:   throat  . POCT rapid strep A    No orders of the defined types were placed in this encounter.        Patient Instructions    You do have strep throat. This will be painful for several days.   Take ibuprofen 800 mg (200 mg 4) 3 times daily, and if that does not give sufficient pain relief you can also take Tylenol (acetaminophen) extra strength 500 mg 2 pills 3 times daily.  Use lozenges and/or gargles as needed for  discomfort.  Take penicillin 500 mg 1 pill 3 times daily for the full 10 days.  Stay out of the face of your family. If they get sick they should come in promptly for treatment.  Return if problems   IF you received an x-ray today, you will receive an invoice from [facility]. Please contact [facility] at [phone] with questions or concerns regarding your invoice.   IF you received labwork today, you will receive an invoice from [facility]. Please contact [facility] at [phone] with questions or concerns regarding your invoice.   Our billing staff will not be able to assist you with questions regarding bills from these companies.  You will be contacted with the lab results as soon as they are available. The fastest way to get your results is to activate your My Chart account. Instructions are located on the last page of this paperwork. If you have not heard from us regarding the results in 2 weeks, please contact this office.         Return if symptoms worsen or fail to improve.   Caroline Mccarter, MD 06/09/2017

## 2017-06-14 ENCOUNTER — Encounter: Payer: Self-pay | Admitting: Family Medicine

## 2017-06-14 ENCOUNTER — Ambulatory Visit (INDEPENDENT_AMBULATORY_CARE_PROVIDER_SITE_OTHER): Payer: BC Managed Care – PPO | Admitting: Family Medicine

## 2017-06-14 VITALS — BP 128/82 | HR 89 | Temp 98.5°F | Resp 16 | Ht 64.57 in | Wt 281.0 lb

## 2017-06-14 DIAGNOSIS — J020 Streptococcal pharyngitis: Secondary | ICD-10-CM | POA: Diagnosis not present

## 2017-06-14 LAB — POCT CBC
Granulocyte percent: 60 %G (ref 37–80)
HCT, POC: 36.9 % — AB (ref 37.7–47.9)
Hemoglobin: 12.6 g/dL (ref 12.2–16.2)
Lymph, poc: 3.3 (ref 0.6–3.4)
MCH: 29.5 pg (ref 27–31.2)
MCHC: 34.1 g/dL (ref 31.8–35.4)
MCV: 86.5 fL (ref 80–97)
MID (CBC): 0.6 (ref 0–0.9)
MPV: 6.2 fL (ref 0–99.8)
PLATELET COUNT, POC: 340 10*3/uL (ref 142–424)
POC Granulocyte: 5.9 (ref 2–6.9)
POC LYMPH %: 33.7 % (ref 10–50)
POC MID %: 6.3 % (ref 0–12)
RBC: 4.26 M/uL (ref 4.04–5.48)
RDW, POC: 13.7 %
WBC: 9.9 10*3/uL (ref 4.6–10.2)

## 2017-06-14 NOTE — Patient Instructions (Addendum)
   IF you received an x-ray today, you will receive an invoice from [facility]. Please contact [facility] at [phone] with questions or concerns regarding your invoice.   IF you received labwork today, you will receive an invoice from [facility]. Please contact [facility] at [phone] with questions or concerns regarding your invoice.   Our billing staff will not be able to assist you with questions regarding bills from these companies.  You will be contacted with the lab results as soon as they are available. The fastest way to get your results is to activate your My Chart account. Instructions are located on the last page of this paperwork. If you have not heard from us regarding the results in 2 weeks, please contact this office.     Strep Throat Strep throat is a bacterial infection of the throat. Your health care provider may call the infection tonsillitis or pharyngitis, depending on whether there is swelling in the tonsils or at the back of the throat. Strep throat is most common during the cold months of the year in children who are 5-15 years of age, but it can happen during any season in people of any age. This infection is spread from person to person (contagious) through coughing, sneezing, or close contact. What are the causes? Strep throat is caused by the bacteria called Streptococcus pyogenes. What increases the risk? This condition is more likely to develop in:  People who spend time in crowded places where the infection can spread easily.  People who have close contact with someone who has strep throat.  What are the signs or symptoms? Symptoms of this condition include:  Fever or chills.  Redness, swelling, or pain in the tonsils or throat.  Pain or difficulty when swallowing.  White or yellow spots on the tonsils or throat.  Swollen, tender glands in the neck or under the jaw.  Red rash all over the body (rare).  How is this  diagnosed? This condition is diagnosed by performing a rapid strep test or by taking a swab of your throat (throat culture test). Results from a rapid strep test are usually ready in a few minutes, but throat culture test results are available after one or two days. How is this treated? This condition is treated with antibiotic medicine. Follow these instructions at home: Medicines  Take over-the-counter and prescription medicines only as told by your health care provider.  Take your antibiotic as told by your health care provider. Do not stop taking the antibiotic even if you start to feel better.  Have family members who also have a sore throat or fever tested for strep throat. They may need antibiotics if they have the strep infection. Eating and drinking  Do not share food, drinking cups, or personal items that could cause the infection to spread to other people.  If swallowing is difficult, try eating soft foods until your sore throat feels better.  Drink enough fluid to keep your urine clear or pale yellow. General instructions  Gargle with a salt-water mixture 3-4 times per day or as needed. To make a salt-water mixture, completely dissolve -1 tsp of salt in 1 cup of warm water.  Make sure that all household members wash their hands well.  Get plenty of rest.  Stay home from school or work until you have been taking antibiotics for 24 hours.  Keep all follow-up visits as told by your health care provider. This is important. Contact a health care provider   if:  The glands in your neck continue to get bigger.  You develop a rash, cough, or earache.  You cough up a thick liquid that is green, yellow-brown, or bloody.  You have pain or discomfort that does not get better with medicine.  Your problems seem to be getting worse rather than better.  You have a fever. Get help right away if:  You have new symptoms, such as vomiting, severe headache, stiff or painful neck,  chest pain, or shortness of breath.  You have severe throat pain, drooling, or changes in your voice.  You have swelling of the neck, or the skin on the neck becomes red and tender.  You have signs of dehydration, such as fatigue, dry mouth, and decreased urination.  You become increasingly sleepy, or you cannot wake up completely.  Your joints become red or painful. This information is not intended to replace advice given to you by your health care provider. Make sure you discuss any questions you have with your health care provider. Document Released: 10/11/2000 Document Revised: 06/12/2016 Document Reviewed: 02/06/2015 Elsevier Interactive Patient Education  2017 Elsevier Inc.  

## 2017-06-14 NOTE — Progress Notes (Signed)
Subjective:    Patient ID: Caroline Howe, female    DOB: [birth year], 31 y.o.   MRN: [id number]  06/14/2017  Sore Throat (follow-up from 8/13 pt states she does not feel better )   HPI This 31 y.o. female presents for evaluation of strep throat with persistent symptoms.  Evaluated five days ago and still having L sided throat pain and L sided LAD.  Last fever two days ago.  No persistent chills/sweats.  Persistent headache.  No rhinorrhea or nasal congestion.  L ear pain.  No drooling or spitting.  +voice changes.  +cough at nighttime only; +PND.  No n/v/d.  Son and husband also tested positive for step throat.  Starting school in 48 hours.  Needs to be better.   BP Readings from Last 3 Encounters:  06/14/17 128/82  06/09/17 138/86  05/07/17 121/80   Wt Readings from Last 3 Encounters:  06/14/17 281 lb (127.5 kg)  06/09/17 278 lb (126.1 kg)  05/07/17 282 lb (127.9 kg)    There is no immunization history on file for this patient.  Review of Systems  Constitutional: Negative for chills, diaphoresis, fatigue and fever.  HENT: Positive for sore throat. Negative for ear pain, postnasal drip, rhinorrhea, sinus pressure and trouble swallowing.   Respiratory: Positive for cough. Negative for shortness of breath.   Cardiovascular: Negative for chest pain, palpitations and leg swelling.  Gastrointestinal: Negative for abdominal pain, constipation, diarrhea, nausea and vomiting.  Hematological: Positive for adenopathy.    Past Medical History:  Diagnosis Date  . Anxiety   . GERD (gastroesophageal reflux disease)   . Hypothyroidism   . Postpartum care following cesarean delivery (7/4) 05/01/2016   Past Surgical History:  Procedure Laterality Date  . CESAREAN SECTION N/A 04/30/2016   Procedure: CESAREAN SECTION;  Surgeon: Shea Evans, MD;  Location: [facility] BIRTHING SUITES;  Service: Obstetrics;  Laterality: N/A;  . WISDOM TOOTH EXTRACTION     No Known Allergies Current Outpatient  Prescriptions  Medication Sig Dispense Refill  . fluticasone (FLONASE) 50 MCG/ACT nasal spray Place 2 sprays into both nostrils daily. 16 g 0  . levothyroxine (SYNTHROID, LEVOTHROID) 112 MCG tablet Take 112 mcg by mouth daily before breakfast.    . norethindrone-ethinyl estradiol (CYCLAFEM,ALYACEN) 0.5/0.75/1-35 MG-MCG tablet Take 1 tablet by mouth daily.    . norgestimate-ethinyl estradiol (ORTHO-CYCLEN,SPRINTEC,PREVIFEM) 0.25-35 MG-MCG tablet Take 1 tablet by mouth daily.    . penicillin v potassium (VEETID) 500 MG tablet Take 1 tablet (500 mg total) by mouth 3 (three) times daily. 30 tablet 0   No current facility-administered medications for this visit.    Social History   Social History  . Marital status: Married    Spouse name: N/A  . Number of children: N/A  . Years of education: N/A   Occupational History  . Not on file.   Social History Main Topics  . Smoking status: Never Smoker  . Smokeless tobacco: Never Used  . Alcohol use Yes     Comment: occasional  . Drug use: No  . Sexual activity: Yes    Birth control/ protection: Condom   Other Topics Concern  . Not on file   Social History Narrative  . No narrative on file   Family History  Problem Relation Age of Onset  . Diabetes Mother   . Hypertension Father   . Liver cancer Maternal Grandfather   . Colon cancer Paternal Grandfather        Objective:    BP 128/82  Pulse 89   Temp 98.5 F (36.9 C) (Oral)   Resp 16   Ht 5' 4.57" (1.64 m)   Wt 281 lb (127.5 kg)   LMP 05/19/2017 (Approximate)   SpO2 97%   BMI 47.39 kg/m  Physical Exam  Constitutional: She is oriented to person, place, and time. She appears well-developed and well-nourished. No distress.  HENT:  Head: Normocephalic and atraumatic.  Right Ear: Tympanic membrane, external ear and ear canal normal.  Left Ear: Tympanic membrane, external ear and ear canal normal.  Mouth/Throat: Uvula is midline and mucous membranes are normal. No oral  lesions. No uvula swelling. Posterior oropharyngeal erythema present. No oropharyngeal exudate, posterior oropharyngeal edema or tonsillar abscesses.  B hypertrophied tonsils.  No uvula deviation.  Eyes: Pupils are equal, round, and reactive to light. Conjunctivae are normal.  Neck: Normal range of motion. Neck supple. No thyromegaly present.  Cardiovascular: Normal rate, regular rhythm and normal heart sounds.  Exam reveals no gallop and no friction rub.   No murmur heard. Pulmonary/Chest: Effort normal and breath sounds normal. She has no wheezes. She has no rales.  Lymphadenopathy:    She has cervical adenopathy.  Neurological: She is alert and oriented to person, place, and time.  Skin: She is not diaphoretic.  Psychiatric: She has a normal mood and affect. Her behavior is normal.  Nursing note and vitals reviewed.  Results for orders placed or performed in visit on 06/14/17  POCT CBC  Result Value Ref Range   WBC 9.9 4.6 - 10.2 K/uL   Lymph, poc 3.3 0.6 - 3.4   POC LYMPH PERCENT 33.7 10 - 50 %L   MID (cbc) 0.6 0 - 0.9   POC MID % 6.3 0 - 12 %M   POC Granulocyte 5.9 2 - 6.9   Granulocyte percent 60.0 37 - 80 %G   RBC 4.26 4.04 - 5.48 M/uL   Hemoglobin 12.6 12.2 - 16.2 g/dL   HCT, POC 16.1 (A) 09.6 - 47.9 %   MCV 86.5 80 - 97 fL   MCH, POC 29.5 27 - 31.2 pg   MCHC 34.1 31.8 - 35.4 g/dL   RDW, POC 04.5 %   Platelet Count, POC 340 142 - 424 K/uL   MPV 6.2 0 - 99.8 fL   No results found. Depression screen [facility] 2/9 06/14/2017 06/09/2017 05/01/2017 11/07/2016 06/10/2015  Decreased Interest 0 0 0 0 0  Down, Depressed, Hopeless 0 0 0 0 0  PHQ - 2 Score 0 0 0 0 0   Fall Risk  06/14/2017 06/09/2017 05/01/2017 11/07/2016  Falls in the past year? No No No No        Assessment & Plan:   1. Streptococcal sore throat    -with persistent symptoms despite five days of PCN.  Normal CBC: reassurance provided; continue with current treatment plan. -RTC inability to swallow.   Orders Placed  This Encounter  Procedures  . POCT CBC   No orders of the defined types were placed in this encounter.   No Follow-up on file.   Kc Summerson Paulita Fujita, M.D. Primary Care at [facility] previously [facility] [address] [place], [place]  [phone] phone [phone] fax

## 2017-08-28 ENCOUNTER — Ambulatory Visit (INDEPENDENT_AMBULATORY_CARE_PROVIDER_SITE_OTHER): Payer: BC Managed Care – PPO | Admitting: Physician Assistant

## 2017-08-28 VITALS — BP 126/78 | HR 102 | Temp 100.2°F | Resp 16 | Ht 64.0 in | Wt 281.0 lb

## 2017-08-28 DIAGNOSIS — J029 Acute pharyngitis, unspecified: Secondary | ICD-10-CM | POA: Diagnosis not present

## 2017-08-28 DIAGNOSIS — J069 Acute upper respiratory infection, unspecified: Secondary | ICD-10-CM

## 2017-08-28 DIAGNOSIS — R0981 Nasal congestion: Secondary | ICD-10-CM

## 2017-08-28 LAB — POCT RAPID STREP A (OFFICE): Rapid Strep A Screen: NEGATIVE

## 2017-08-28 LAB — POCT INFLUENZA A/B
INFLUENZA A, POC: NEGATIVE
INFLUENZA B, POC: NEGATIVE

## 2017-08-28 MED ORDER — GUAIFENESIN ER 1200 MG PO TB12: 1.0000 | ORAL_TABLET | Freq: Two times a day (BID) | ORAL | 1 refills | Status: DC | PRN

## 2017-08-28 NOTE — Progress Notes (Signed)
PRIMARY CARE AT [facility] [address], [place] [address] [phone]  Date:  08/28/2017   Name:  Reida Hem   DOB:  Jul 02, 1986   MRN:  [id number]  PCP:  Jaynie Bream, MD    History of Present Illness:  Shandie Bertz is a 31 y.o. female patient who presents to PCP with  Chief Complaint  Patient presents with  . Sinusitis    pain in head and eyes/ x 2 days  . Fever    100.2  . Sore Throat     2 days of allergy like symptoms of nasal congestion, facial discomfort, ear discomfort.  No ear drainage.     Last night, she had a decline with fever of 102.  No runny nose.  No sob or dyspnea.   This morning 100.  She has no cough.  Body aches.  No change in stool or abdominal pain.     She has not had the flu vaccine.       Patient Active Problem List   Diagnosis Date Noted  . Cesarean delivery delivered 05/01/2016  . Postpartum care following cesarean delivery (7/4) 05/01/2016  . Hypothyroid in pregnancy, antepartum 04/29/2016  . Hypothyroidism 10/04/2013  . GERD (gastroesophageal reflux disease) 10/04/2013    Past Medical History:  Diagnosis Date  . Anxiety   . GERD (gastroesophageal reflux disease)   . Hypothyroidism   . Postpartum care following cesarean delivery (7/4) 05/01/2016    Past Surgical History:  Procedure Laterality Date  . CESAREAN SECTION N/A 04/30/2016   Procedure: CESAREAN SECTION;  Surgeon: Shea Evans, MD;  Location: [facility] BIRTHING SUITES;  Service: Obstetrics;  Laterality: N/A;  . WISDOM TOOTH EXTRACTION      Social History  Substance Use Topics  . Smoking status: Never Smoker  . Smokeless tobacco: Never Used  . Alcohol use Yes     Comment: occasional    Family History  Problem Relation Age of Onset  . Diabetes Mother   . Hypertension Father   . Liver cancer Maternal Grandfather   . Colon cancer Paternal Grandfather     No Known Allergies  Medication list has been reviewed and updated.  Current Outpatient Prescriptions on File Prior  to Visit  Medication Sig Dispense Refill  . levothyroxine (SYNTHROID, LEVOTHROID) 112 MCG tablet Take 112 mcg by mouth daily before breakfast.    . norgestimate-ethinyl estradiol (ORTHO-CYCLEN,SPRINTEC,PREVIFEM) 0.25-35 MG-MCG tablet Take 1 tablet by mouth daily.    . fluticasone (FLONASE) 50 MCG/ACT nasal spray Place 2 sprays into both nostrils daily. (Patient not taking: Reported on 08/28/2017) 16 g 0  . norethindrone-ethinyl estradiol (CYCLAFEM,ALYACEN) 0.5/0.75/1-35 MG-MCG tablet Take 1 tablet by mouth daily.    . penicillin v potassium (VEETID) 500 MG tablet Take 1 tablet (500 mg total) by mouth 3 (three) times daily. (Patient not taking: Reported on 08/28/2017) 30 tablet 0   No current facility-administered medications on file prior to visit.     ROS ROS otherwise unremarkable unless listed above.  Physical Examination: BP 126/78   Pulse (!) 102   Temp 100.2 F (37.9 C) (Oral)   Resp 16   Ht 5\' 4"  (1.626 m)   Wt 281 lb (127.5 kg)   LMP 08/11/2017   SpO2 98%   Breastfeeding? No   BMI 48.23 kg/m  Ideal Body Weight: Weight in (lb) to have BMI = 25: 145.3  Physical Exam  Constitutional: She is oriented to person, place, and time. She appears well-developed and well-nourished. No  distress.  HENT:  Head: Normocephalic and atraumatic.  Right Ear: Tympanic membrane, external ear and ear canal normal.  Left Ear: Tympanic membrane, external ear and ear canal normal.  Nose: Mucosal edema and rhinorrhea present. Right sinus exhibits no maxillary sinus tenderness and no frontal sinus tenderness. Left sinus exhibits no maxillary sinus tenderness and no frontal sinus tenderness.  Mouth/Throat: No uvula swelling. No oropharyngeal exudate, posterior oropharyngeal edema or posterior oropharyngeal erythema.  Eyes: Conjunctivae and EOM are normal. Pupils are equal, round, and reactive to light.  Cardiovascular: Normal rate and regular rhythm. Exam reveals no gallop, no distant heart sounds and  no friction rub.  No murmur heard. Pulmonary/Chest: Effort normal. No respiratory distress. She has no decreased breath sounds. She has no wheezes. She has no rhonchi.  Lymphadenopathy:       Head (right side): No submandibular, no tonsillar, no preauricular and no posterior auricular adenopathy present.       Head (left side): No submandibular, no tonsillar, no preauricular and no posterior auricular adenopathy present.  Neurological: She is alert and oriented to person, place, and time.  Skin: She is not diaphoretic.  Psychiatric: She has a normal mood and affect. Her behavior is normal.   Results for orders placed or performed in visit on 08/28/17  Culture, Group A Strep  Result Value Ref Range   Strep A Culture Negative   POCT Influenza A/B  Result Value Ref Range   Influenza A, POC Negative Negative   Influenza B, POC Negative Negative  POCT rapid strep A  Result Value Ref Range   Rapid Strep A Screen Negative Negative     Assessment and Plan: Ludwig LeanSarah Gallacher is a 31 y.o. female who is here today for cc of  Chief Complaint  Patient presents with  . Sinusitis    pain in head and eyes/ x 2 days  . Fever    100.2  . Sore Throat   Acute upper respiratory infection  Sore throat - Plan: POCT Influenza A/B, POCT rapid strep A, Guaifenesin (MUCINEX MAXIMUM STRENGTH) 1200 MG TB12, Culture, Group A Strep  Nasal congestion - Plan: POCT Influenza A/B, POCT rapid strep A, Guaifenesin (MUCINEX MAXIMUM STRENGTH) 1200 MG TB12  Trena PlattStephanie English, PA-C [facility] 11/6/201810:53 AM

## 2017-08-28 NOTE — Patient Instructions (Addendum)
Please swap tylenol and ibuprofen every 6-8 hours. You can take cepacol lozenges for the throat pain Please hydrate with 64oz if not more, of water  Viral Respiratory Infection A viral respiratory infection is an illness that affects parts of the body used for breathing, like the lungs, nose, and throat. It is caused by a germ called a virus. Some examples of this kind of infection are:  A cold.  The flu (influenza).  A respiratory syncytial virus (RSV) infection.  How do I know if I have this infection? Most of the time this infection causes:  A stuffy or runny nose.  Yellow or green fluid in the nose.  A cough.  Sneezing.  Tiredness (fatigue).  Achy muscles.  A sore throat.  Sweating or chills.  A fever.  A headache.  How is this infection treated? If the flu is diagnosed early, it may be treated with an antiviral medicine. This medicine shortens the length of time a person has symptoms. Symptoms may be treated with over-the-counter and prescription medicines, such as:  Expectorants. These make it easier to cough up mucus.  Decongestant nasal sprays.  Doctors do not prescribe antibiotic medicines for viral infections. They do not work with this kind of infection. How do I know if I should stay home? To keep others from getting sick, stay home if you have:  A fever.  A lasting cough.  A sore throat.  A runny nose.  Sneezing.  Muscles aches.  Headaches.  Tiredness.  Weakness.  Chills.  Sweating.  An upset stomach (nausea).  Follow these instructions at home:  Rest as much as possible.  Take over-the-counter and prescription medicines only as told by your doctor.  Drink enough fluid to keep your pee (urine) clear or pale yellow.  Gargle with salt water. Do this 3-4 times per day or as needed. To make a salt-water mixture, dissolve -1 tsp of salt in 1 cup of warm water. Make sure the salt dissolves all the way.  Use nose drops made from  salt water. This helps with stuffiness (congestion). It also helps soften the skin around your nose.  Do not drink alcohol.  Do not use tobacco products, including cigarettes, chewing tobacco, and e-cigarettes. If you need help quitting, ask your doctor. Get help if:  Your symptoms last for 10 days or longer.  Your symptoms get worse over time.  You have a fever.  You have very bad pain in your face or forehead.  Parts of your jaw or neck become very swollen. Get help right away if:  You feel pain or pressure in your chest.  You have shortness of breath.  You faint or feel like you will faint.  You keep throwing up (vomiting).  You feel confused. This information is not intended to replace advice given to you by your health care provider. Make sure you discuss any questions you have with your health care provider. Document Released: 09/26/2008 Document Revised: 03/21/2016 Document Reviewed: 03/22/2015 Elsevier Interactive Patient Education  2018 [employer]      IF you received an x-ray today, you will receive an [facility] from [facility]. Please contact [facility] at [phone] with questions or concerns regarding your invoice.   IF you received labwork today, you will receive an invoice from [facility]. Please contact [facility] at [phone] with questions or concerns regarding your invoice.   Our billing staff will not be able to assist you with questions regarding bills from these companies.  You will be contacted with the lab results as soon as they are available. The fastest way to get your results is to activate your My Chart account. Instructions are located on the last page of this paperwork. If you have not heard from [place] regarding the results in 2 weeks, please contact this office.

## 2017-09-01 LAB — CULTURE, GROUP A STREP: STREP A CULTURE: NEGATIVE

## 2017-09-02 ENCOUNTER — Encounter: Payer: Self-pay | Admitting: Physician Assistant

## 2017-10-15 ENCOUNTER — Ambulatory Visit: Payer: BC Managed Care – PPO | Admitting: Physician Assistant

## 2017-10-16 ENCOUNTER — Ambulatory Visit: Payer: BC Managed Care – PPO | Admitting: Physician Assistant

## 2017-10-20 ENCOUNTER — Emergency Department (HOSPITAL_COMMUNITY): Payer: BC Managed Care – PPO

## 2017-10-20 ENCOUNTER — Other Ambulatory Visit: Payer: Self-pay

## 2017-10-20 ENCOUNTER — Emergency Department (HOSPITAL_COMMUNITY)
Admission: EM | Admit: 2017-10-20 | Discharge: 2017-10-20 | Disposition: A | Discharge status: Home / Self Care | Payer: BC Managed Care – PPO | Attending: Emergency Medicine | Admitting: Emergency Medicine

## 2017-10-20 DIAGNOSIS — E860 Dehydration: Secondary | ICD-10-CM | POA: Diagnosis not present

## 2017-10-20 DIAGNOSIS — N3000 Acute cystitis without hematuria: Secondary | ICD-10-CM | POA: Insufficient documentation

## 2017-10-20 DIAGNOSIS — R1011 Right upper quadrant pain: Secondary | ICD-10-CM

## 2017-10-20 DIAGNOSIS — R112 Nausea with vomiting, unspecified: Secondary | ICD-10-CM | POA: Diagnosis not present

## 2017-10-20 DIAGNOSIS — R1012 Left upper quadrant pain: Secondary | ICD-10-CM | POA: Diagnosis present

## 2017-10-20 LAB — URINALYSIS, ROUTINE W REFLEX MICROSCOPIC
Glucose, UA: NEGATIVE mg/dL
Hgb urine dipstick: NEGATIVE
KETONES UR: 5 mg/dL — AB
Nitrite: NEGATIVE
PROTEIN: 100 mg/dL — AB
SPECIFIC GRAVITY, URINE: 1.031 — AB (ref 1.005–1.030)
pH: 5 (ref 5.0–8.0)

## 2017-10-20 LAB — CBC WITH DIFFERENTIAL/PLATELET
BASOS ABS: 0.1 10*3/uL (ref 0.0–0.1)
BASOS PCT: 1 %
EOS ABS: 0 10*3/uL (ref 0.0–0.7)
EOS PCT: 0 %
HCT: 38.3 % (ref 36.0–46.0)
HEMOGLOBIN: 13 g/dL (ref 12.0–15.0)
LYMPHS ABS: 3.4 10*3/uL (ref 0.7–4.0)
Lymphocytes Relative: 37 %
MCH: 29 pg (ref 26.0–34.0)
MCHC: 33.9 g/dL (ref 30.0–36.0)
MCV: 85.3 fL (ref 78.0–100.0)
Monocytes Absolute: 0.5 10*3/uL (ref 0.1–1.0)
Monocytes Relative: 6 %
NEUTROS PCT: 56 %
Neutro Abs: 5.1 10*3/uL (ref 1.7–7.7)
PLATELETS: 151 10*3/uL (ref 150–400)
RBC: 4.49 MIL/uL (ref 3.87–5.11)
RDW: 14.8 % (ref 11.5–15.5)
WBC: 9.1 10*3/uL (ref 4.0–10.5)

## 2017-10-20 LAB — I-STAT CG4 LACTIC ACID, ED: LACTIC ACID, VENOUS: 1.03 mmol/L (ref 0.5–1.9)

## 2017-10-20 LAB — COMPREHENSIVE METABOLIC PANEL
ALK PHOS: 190 U/L — AB (ref 38–126)
ALT: 25 U/L (ref 14–54)
AST: 44 U/L — AB (ref 15–41)
Albumin: 3.1 g/dL — ABNORMAL LOW (ref 3.5–5.0)
Anion gap: 11 (ref 5–15)
BILIRUBIN TOTAL: 0.9 mg/dL (ref 0.3–1.2)
BUN: 6 mg/dL (ref 6–20)
CALCIUM: 7.9 mg/dL — AB (ref 8.9–10.3)
CO2: 24 mmol/L (ref 22–32)
Chloride: 97 mmol/L — ABNORMAL LOW (ref 101–111)
Creatinine, Ser: 0.87 mg/dL (ref 0.44–1.00)
GFR calc Af Amer: >60 mL/min (ref >60)
Glucose, Bld: 101 mg/dL — ABNORMAL HIGH (ref 65–99)
POTASSIUM: 3.4 mmol/L — AB (ref 3.5–5.1)
Sodium: 132 mmol/L — ABNORMAL LOW (ref 135–145)
TOTAL PROTEIN: 6.4 g/dL — AB (ref 6.5–8.1)

## 2017-10-20 LAB — I-STAT BETA HCG BLOOD, ED (MC, WL, AP ONLY): I-stat hCG, quantitative: <5 m[IU]/mL (ref <5)

## 2017-10-20 LAB — LIPASE, BLOOD: Lipase: 29 U/L (ref 11–51)

## 2017-10-20 MED ORDER — DEXTROSE 5 % IV SOLN
1.0000 g | Freq: Once | INTRAVENOUS | Status: AC
  Administered 2017-10-20: 1 g via INTRAVENOUS
  Filled 2017-10-20: qty 10

## 2017-10-20 MED ORDER — LACTATED RINGERS IV BOLUS (SEPSIS)
1000.0000 mL | Freq: Once | INTRAVENOUS | Status: AC
  Administered 2017-10-20: 1000 mL via INTRAVENOUS

## 2017-10-20 MED ORDER — ONDANSETRON HCL 4 MG/2ML IJ SOLN
4.0000 mg | Freq: Once | INTRAMUSCULAR | Status: AC
  Administered 2017-10-20: 4 mg via INTRAVENOUS
  Filled 2017-10-20: qty 2

## 2017-10-20 MED ORDER — ONDANSETRON 4 MG PO TBDP: 4.0000 mg | ORAL_TABLET | Freq: Three times a day (TID) | ORAL | 0 refills | Status: DC | PRN

## 2017-10-20 MED ORDER — ACETAMINOPHEN 500 MG PO TABS
1000.0000 mg | ORAL_TABLET | Freq: Once | ORAL | Status: AC
  Administered 2017-10-20: 1000 mg via ORAL
  Filled 2017-10-20: qty 2

## 2017-10-20 MED ORDER — CEFPODOXIME PROXETIL 200 MG PO TABS: 200.0000 mg | ORAL_TABLET | Freq: Two times a day (BID) | ORAL | 0 refills | Status: AC

## 2017-10-20 MED ORDER — SODIUM CHLORIDE 0.9 % IV BOLUS (SEPSIS)
1000.0000 mL | Freq: Once | INTRAVENOUS | Status: AC
  Administered 2017-10-20: 1000 mL via INTRAVENOUS

## 2017-10-20 MED ORDER — POTASSIUM CHLORIDE CRYS ER 20 MEQ PO TBCR
40.0000 meq | EXTENDED_RELEASE_TABLET | Freq: Once | ORAL | Status: AC
  Administered 2017-10-20: 40 meq via ORAL
  Filled 2017-10-20: qty 2

## 2017-10-20 MED ORDER — PROMETHAZINE HCL 25 MG RE SUPP: 25.0000 mg | Freq: Four times a day (QID) | RECTAL | 0 refills | Status: DC | PRN

## 2017-10-20 MED ORDER — SODIUM CHLORIDE 0.9 % IV BOLUS (SEPSIS): 1000.0000 mL | Freq: Once | INTRAVENOUS | Status: DC

## 2017-10-20 NOTE — ED Triage Notes (Signed)
Pt arrived via [facility] from [facility] with complaints of Hypotension 90/60s. Pt complains of N/V/D for the last 3 days and not able to keep anything down. No vomiting today just dry heaving. Per [facility] her bp was 115/70s and no changes from sitting to standing. No complaints of dizziness or weakness.

## 2017-10-20 NOTE — ED Notes (Signed)
Patient transported to X-ray 

## 2017-10-20 NOTE — ED Notes (Signed)
Pt is hard stick, will be attempting US IV

## 2017-10-20 NOTE — ED Notes (Signed)
Bed: WA09 Expected date:  Expected time:  Means of arrival:  Comments: 31 yo hypotension, N/V

## 2017-10-20 NOTE — ED Notes (Signed)
Pt taking amox/clauv for sinus infection for 5 days now

## 2017-10-20 NOTE — ED Provider Notes (Signed)
[facility]-EMERGENCY DEPT Provider Note   CSN: [id number] Arrival date & time: 10/20/17  1536     History   Chief Complaint Chief Complaint  Patient presents with  . Hypotension    HPI Caroline Howe is a 31 y.o. female.  HPI 31 year old female with past medical history as below who presents with fever, chills, and general fatigue.  The patient states her symptoms started approximately a week and a half ago.  She developed stuffy nose, rhinorrhea, and sinus pressure.  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  She started Augmentin on Wednesday of last week, then developed nausea and abdominal pain starting on Friday.  She reports that she has occasional sharp, stabbing, upper abdominal pain with eating along with nausea and vomiting.  She is also had generalized fevers, chills, and persistent fatigue.  She said lightheadedness with standing.  She had difficulty eating or drinking anything.  Denies any urine symptoms other than darker urine.  She went to urgent care for reevaluation today and was noted to be significantly orthostatic and was subsequent sent here for evaluation.  She has not had any Tylenol or Advil since 10 AM today.   Past Medical History:  Diagnosis Date  . Anxiety   . GERD (gastroesophageal reflux disease)   . Hypothyroidism   . Postpartum care following cesarean delivery (7/4) 05/01/2016    Patient Active Problem List   Diagnosis Date Noted  . Cesarean delivery delivered 05/01/2016  . Postpartum care following cesarean delivery (7/4) 05/01/2016  . Hypothyroid in pregnancy, antepartum 04/29/2016  . Hypothyroidism 10/04/2013  . GERD (gastroesophageal reflux disease) 10/04/2013    Past Surgical History:  Procedure Laterality Date  . CESAREAN SECTION N/A 04/30/2016   Procedure: CESAREAN SECTION;  Surgeon: Shea EvansVaishali Mody, MD;  [facility];  Service: Obstetrics;  Laterality: N/A;  . WISDOM  TOOTH EXTRACTION      OB History    Gravida Para Term Preterm AB Living   1 1 1     1    SAB TAB Ectopic Multiple Live Births         0 1       Home Medications    Prior to Admission medications   Medication Sig Start Date End Date Taking? Authorizing Provider  amoxicillin-clavulanate (AUGMENTIN) 875-125 MG tablet Take 1 tablet by mouth 2 (two) times daily. 10/15/17  Yes [provider]  levothyroxine (SYNTHROID, LEVOTHROID) 112 MCG tablet Take 112 mcg by mouth daily before breakfast.   Yes [provider]  norgestimate-ethinyl estradiol (ORTHO-CYCLEN,SPRINTEC,PREVIFEM) 0.25-35 MG-MCG tablet Take 1 tablet by mouth daily.   Yes [provider]  cefpodoxime (VANTIN) 200 MG tablet Take 1 tablet (200 mg total) by mouth 2 (two) times daily for 10 days. 10/20/17 10/30/17  Shaune PollackIsaacs, Marysue Fait, MD  fluticasone (FLONASE) 50 MCG/ACT nasal spray Place 2 sprays into both nostrils daily. Patient not taking: Reported on 08/28/2017 05/07/17   Peyton NajjarHopper, David H, MD  Guaifenesin [place](MUCINEX MAXIMUM STRENGTH) 1200 MG TB12 Take 1 tablet (1,200 mg total) by mouth every 12 (twelve) hours as needed. Patient not taking: Reported on 10/20/2017 08/28/17   Trena PlattEnglish, Stephanie D, PA  ondansetron (ZOFRAN ODT) 4 MG disintegrating tablet Take 1 tablet (4 mg total) by mouth every 8 (eight) hours as needed for nausea or vomiting. 10/20/17   Shaune PollackIsaacs, Analise Glotfelty, MD  penicillin v potassium (VEETID) 500 MG tablet Take 1 tablet (500 mg total) by mouth 3 (three) times  daily. Patient not taking: Reported on 08/28/2017 06/09/17   Peyton Najjar, MD  promethazine (PHENERGAN) 25 MG suppository Place 1 suppository (25 mg total) rectally every 6 (six) hours as needed for nausea or vomiting. 10/20/17   Shaune Pollack, MD    Family History Family History  Problem Relation Age of Onset  . Diabetes Mother   . Hypertension Father   . Liver cancer Maternal Grandfather   . Colon cancer Paternal Grandfather     Social  History Social History   Tobacco Use  . Smoking status: Never Smoker  . Smokeless tobacco: Never Used  Substance Use Topics  . Alcohol use: Yes    Comment: occasional  . Drug use: No     Allergies   Patient has no known allergies.   Review of Systems Review of Systems  Constitutional: Positive for chills and fatigue.  HENT: Positive for congestion, rhinorrhea and sinus pressure (Improved after starting Augmentin).   Gastrointestinal: Positive for abdominal pain, nausea and vomiting. Negative for constipation and diarrhea.  Neurological: Positive for dizziness (With standing) and weakness.     Physical Exam Updated Vital Signs BP 128/78 (BP Location: Left Arm)   Pulse 97   Temp (!) 102.1 F (38.9 C) (Oral)   Resp 16   Ht 5\' 4"  (1.626 m)   Wt 129.3 kg (285 lb)   LMP 10/06/2017   SpO2 97%   BMI 48.92 kg/m   Physical Exam  Constitutional: She is oriented to person, place, and time. She appears well-developed and well-nourished. No distress.  HENT:  Head: Normocephalic and atraumatic.  Markedly dry mucous membranes.  Mild posterior pharyngeal erythema.  No sinus erythema or tenderness to percussion bilaterally.  Eyes: Conjunctivae are normal.  Neck: Neck supple.  Cardiovascular: Normal rate, regular rhythm and normal heart sounds. Exam reveals no friction rub.  No murmur heard. Pulmonary/Chest: Effort normal and breath sounds normal. No respiratory distress. She has no wheezes. She has no rales.  Abdominal: Soft. Bowel sounds are normal. She exhibits no distension. There is tenderness (Mild, right upper quadrant). There is no rebound and no guarding.  Musculoskeletal: She exhibits no edema.  Neurological: She is alert and oriented to person, place, and time. She exhibits normal muscle tone.  Skin: Skin is warm. Capillary refill takes less than 2 seconds.  Psychiatric: She has a normal mood and affect.  Nursing note and vitals reviewed.    ED Treatments / Results    Labs (all labs ordered are listed, but only abnormal results are displayed) Labs Reviewed  COMPREHENSIVE METABOLIC PANEL - Abnormal; Notable for the following components:      Result Value   Sodium 132 (*)    Potassium 3.4 (*)    Chloride 97 (*)    Glucose, Bld 101 (*)    Calcium 7.9 (*)    Total Protein 6.4 (*)    Albumin 3.1 (*)    AST 44 (*)    Alkaline Phosphatase 190 (*)    All other components within normal limits  URINALYSIS, ROUTINE W REFLEX MICROSCOPIC - Abnormal; Notable for the following components:   Color, Urine AMBER (*)    APPearance HAZY (*)    Specific Gravity, Urine 1.031 (*)    Bilirubin Urine SMALL (*)    Ketones, ur 5 (*)    Protein, ur 100 (*)    Leukocytes, UA TRACE (*)    Bacteria, UA RARE (*)    Squamous Epithelial / LPF 6-30 (*)  All other components within normal limits  CULTURE, BLOOD (ROUTINE X 2)  CULTURE, BLOOD (ROUTINE X 2)  URINE CULTURE  CBC WITH DIFFERENTIAL/PLATELET  LIPASE, BLOOD  I-STAT CG4 LACTIC ACID, ED  I-STAT BETA HCG BLOOD, ED ([facility], [facility], [facility] ONLY)  I-STAT CG4 LACTIC ACID, ED    EKG  EKG Interpretation None       Radiology Dg Chest 2 View  Result Date: 10/20/2017 CLINICAL DATA:  Pt c/o nausea x 3 days ago and fever onset x approx 1 week ago. Also had episode of hypotension today while at PCP. Pt nondiabetic. NonHTN. Denies any chest history. Nonsmoker. EXAM: CHEST  2 VIEW COMPARISON:  08/07/2009 FINDINGS: The heart size and mediastinal contours are within normal limits. Both lungs are clear. No pleural effusion or pneumothorax. The visualized skeletal structures are unremarkable. IMPRESSION: No active cardiopulmonary disease. Electronically Signed   By: Amie Portland M.D.   On: 10/20/2017 16:46   US Abdomen Limited Ruq  Result Date: 10/20/2017 CLINICAL DATA:  Abdomen pain EXAM: ULTRASOUND ABDOMEN LIMITED RIGHT UPPER QUADRANT COMPARISON:  None. FINDINGS: Gallbladder: No gallstones. Gallbladder wall thickness upper normal  at 3 mm. No sonographic Murphy sign noted by sonographer. Common bile duct: Diameter: 4.3 mm maximum Liver: No focal lesion identified. Within normal limits in parenchymal echogenicity. Portal vein is patent on color Doppler imaging with normal direction of blood flow towards the liver. IMPRESSION: Negative right upper quadrant abdominal ultrasound Electronically Signed   By: Jasmine Pang M.D.   On: 10/20/2017 18:01    Procedures Procedures (including critical care time)  Medications Ordered in ED Medications  sodium chloride 0.9 % bolus 1,000 mL (1,000 mLs Intravenous Not Given 10/20/17 2010)  sodium chloride 0.9 % bolus 1,000 mL (0 mLs Intravenous Stopped 10/20/17 1814)  acetaminophen (TYLENOL) tablet 1,000 mg (1,000 mg Oral Given 10/20/17 1649)  cefTRIAXone (ROCEPHIN) 1 g in dextrose 5 % 50 mL IVPB (0 g Intravenous Stopped 10/20/17 1744)  ondansetron (ZOFRAN) injection 4 mg (4 mg Intravenous Given 10/20/17 1852)  lactated ringers bolus 1,000 mL (1,000 mLs Intravenous New Bag/Given 10/20/17 1854)     Initial Impression / Assessment and Plan / ED Course  I have reviewed the triage vital signs and the nursing notes.  Pertinent labs & imaging results that were available during my care of the patient were reviewed by me and considered in my medical decision making (see chart for details).     31 year old female who presents with generalized fatigue, chills, and fever with dizziness upon standing.  She was markedly orthostatic at urgent care.  On arrival here, the patient is nontoxic.  Her abdomen is soft but she does have some mild upper quadrant tenderness.  I suspect she has possible ongoing viral GI illness with antibiotic associated nausea, though most also consider cholecystitis, occult pneumonia, or other bacterial etiology.  Will obtain blood cultures, give fluids, and reassess.  She is not hypotensive here, so will hold on full sepsis protocol, and will follow-up labs and reassess as  needed.  Lab work is overall very reassuring.  She has a normal white count.  Normal lactic acid.  She is mildly dehydrated but otherwise has normal renal function.  Right upper quadrant ultrasound and chest x-ray are negative.  She feels markedly improved with fluids.  Her heart rate is now less than 100 and she is tolerating p.o. without difficulty.  I suspect she has likely ongoing URI with possible component of UTI.  She has been given  Rocephin here.  Given that she likely has some component of gastritis due to her Augmentin, will [facility] as this will cover both her sinus infection as well as possible early UTI, and will give Zofran and Phenergan as needed for nausea.  Patient in agreement with this plan.  Final Clinical Impressions(s) / ED Diagnoses   Final diagnoses:  RUQ pain  Dehydration  Acute cystitis without hematuria    ED Discharge Orders        Ordered    cefpodoxime (VANTIN) 200 MG tablet  2 times daily     10/20/17 2003    ondansetron (ZOFRAN ODT) 4 MG disintegrating tablet  Every 8 hours PRN     10/20/17 2003    promethazine (PHENERGAN) 25 MG suppository  Every 6 hours PRN     10/20/17 Karleen Dolphin2003       Janelly Switalski, MD 10/20/17 2011

## 2017-10-22 LAB — URINE CULTURE
Culture: NO GROWTH
SPECIAL REQUESTS: NORMAL

## 2018-01-12 ENCOUNTER — Ambulatory Visit: Payer: BC Managed Care – PPO | Admitting: Family Medicine

## 2018-01-12 ENCOUNTER — Encounter: Payer: Self-pay | Admitting: Family Medicine

## 2018-01-12 ENCOUNTER — Other Ambulatory Visit: Payer: Self-pay

## 2018-01-12 VITALS — BP 116/78 | HR 98 | Temp 99.1°F | Ht 64.57 in | Wt 279.4 lb

## 2018-01-12 DIAGNOSIS — J302 Other seasonal allergic rhinitis: Secondary | ICD-10-CM

## 2018-01-12 MED ORDER — OLOPATADINE HCL 0.2 % OP SOLN: 1.0000 [drp] | Freq: Every day | OPHTHALMIC | 5 refills | Status: AC

## 2018-01-12 MED ORDER — FLUTICASONE PROPIONATE 50 MCG/ACT NA SUSP: 1.0000 | Freq: Every day | NASAL | 6 refills | Status: AC

## 2018-01-12 NOTE — Progress Notes (Signed)
3/18/20195:23 PM  Ludwig LeanSarah Kysar 1986/06/11, 32 y.o. female [id number]  Chief Complaint  Patient presents with  . Nasal Congestion    has been having trouble breathing since last night. Having ear pain as well.    HPI:   Patient is a 32 y.o. female with past medical history significant for seasonal allergies who presents today for worsening symptoms since yesterday.  Patient reports about a week of clear rhinorrhea, itchy watery eyes and mild nasal congestion, switched oral antihistamine without much benefit. Last night became very congested, cant breath thru her nose. Started sudafed. No fever or chills. No sinus pain. No ear pain, though they pop sometimes. No sore throat. + productive cough. No SOB.  [facility] 01/12/2018 08/28/2017 06/14/2017  Decreased Interest 0 0 0  Down, Depressed, Hopeless 0 0 0  PHQ - 2 Score 0 0 0    No Known Allergies  Prior to Admission medications   Medication Sig Start Date End Date Taking? Authorizing Provider  levothyroxine (SYNTHROID, LEVOTHROID) 112 MCG tablet Take 112 mcg by mouth daily before breakfast.   Yes [provider]  norgestimate-ethinyl estradiol (ORTHO-CYCLEN,SPRINTEC,PREVIFEM) 0.25-35 MG-MCG tablet Take 1 tablet by mouth daily.   Yes [provider]    Past Medical History:  Diagnosis Date  . Anxiety   . GERD (gastroesophageal reflux disease)   . Hypothyroidism   . Postpartum care following cesarean delivery (7/4) 05/01/2016    Past Surgical History:  Procedure Laterality Date  . CESAREAN SECTION N/A 04/30/2016   Procedure: CESAREAN SECTION;  Surgeon: Shea EvansVaishali Mody, MD;  [facility];  Service: Obstetrics;  Laterality: N/A;  . WISDOM TOOTH EXTRACTION      Social History   Tobacco Use  . Smoking status: Never Smoker  . Smokeless tobacco: Never Used  Substance Use Topics  . Alcohol use: Yes    Comment: occasional    Family History  Problem Relation Age of Onset  .  Diabetes Mother   . Hypertension Father   . Liver cancer Maternal Grandfather   . Colon cancer Paternal Grandfather     ROS Per hpi  OBJECTIVE:  Blood pressure 116/78, pulse 98, temperature 99.1 F (37.3 C), temperature source Oral, height 5' 4.57" (1.64 m), weight 279 lb 6.4 oz (126.7 kg), SpO2 100 %, not currently breastfeeding.  Physical Exam  Constitutional: She is oriented to person, place, and time and well-developed, well-nourished, and in no distress.  HENT:  Head: Normocephalic and atraumatic.  Right Ear: Hearing, tympanic membrane, external ear and ear canal normal.  Left Ear: Hearing, tympanic membrane, external ear and ear canal normal.  Nose: Mucosal edema and rhinorrhea present. Right sinus exhibits no maxillary sinus tenderness and no frontal sinus tenderness. Left sinus exhibits no maxillary sinus tenderness and no frontal sinus tenderness.  Mouth/Throat: Oropharynx is clear and moist.  Eyes: EOM are normal. Pupils are equal, round, and reactive to light.  Neck: Neck supple.  Cardiovascular: Normal rate, regular rhythm and normal heart sounds. Exam reveals no gallop and no friction rub.  No murmur heard. Pulmonary/Chest: Effort normal and breath sounds normal. She has no wheezes. She has no rales.  Lymphadenopathy:    She has no cervical adenopathy.  Neurological: She is alert and oriented to person, place, and time. Gait normal.  Skin: Skin is warm and dry.    ASSESSMENT and PLAN  1. Seasonal allergies Uncontrolled.. Escalating treatment. Discussed continued use of sudafed, add nasal saline washes. RTC precautions reviewed.  Other orders - fluticasone (FLONASE) 50 MCG/ACT nasal spray; Place 1 spray into both nostrils daily. - Olopatadine HCl 0.2 % SOLN; Apply 1 drop to eye daily.  Return if symptoms worsen or fail to improve.    Myles Lipps, MD Primary Care at [facility] [address] [place], [place] [address] Ph.  [phone] Fax [phone]

## 2018-01-12 NOTE — Patient Instructions (Signed)
     IF you received an x-ray today, you will receive an invoice from [facility]. Please contact [facility] at [phone] with questions or concerns regarding your invoice.   IF you received labwork today, you will receive an invoice from [facility]. Please contact [facility] at [phone] with questions or concerns regarding your invoice.   Our billing staff will not be able to assist you with questions regarding bills from these companies.  You will be contacted with the lab results as soon as they are available. The fastest way to get your results is to activate your My Chart account. Instructions are located on the last page of this paperwork. If you have not heard from us regarding the results in 2 weeks, please contact this office.     

## 2018-01-28 ENCOUNTER — Encounter: Payer: Self-pay | Admitting: Physician Assistant

## 2018-03-13 ENCOUNTER — Encounter: Payer: Self-pay | Admitting: Family Medicine

## 2018-03-18 ENCOUNTER — Encounter: Payer: Self-pay | Admitting: Family Medicine

## 2018-05-21 ENCOUNTER — Ambulatory Visit: Payer: Self-pay

## 2018-05-21 NOTE — Telephone Encounter (Signed)
Phone call from pt.  Reported onset of bilateral, upper, anterior chest pain, intermittently.  Rated the pain at 2/10.  Stated the pain radiates up to the left shoulder intermittently.  Denied nausea associated with the pain, but stated she has been nauseated, due to being very upset and crying, since her father passed away suddenly, on Monday.  Stated she feels her chest pain is related to the anxiety and stress.  Reported she is not short of breath, but feels like it is hard to take a deep breath; thinks this is anxiety-induced.  Reported hx of asthma.  Denied pain upon taking a deep breath.  The pt. reported she thinks she is fine, and is calling to give her family peace of mind. St[place], and due to funeral arrangements for her father, does not have time to drive to [place] right now.   [facility]; since Dr. Leretha PolSantiago not avail today, advised to have pt. eval. In ER.  Spoke with pt. and recommended to go to the ER.  Does not feel that she needs to go to the ER.  Questioned why she can't be seen in office just to have her BP checked?  Advised to go to UC today, if not willing to go to the ER.  Stated she will consider going to UC.     Reason for Disposition . Pain also present in shoulder(s) or arm(s) or jaw  (Exception: pain is clearly made worse by movement)    Pt. Reported chest pain in upper chest bilaterally and radiates up to the left shoulder; rated at 2/10  Answer Assessment - Initial Assessment Questions 1. LOCATION: "Where does it hurt?"       All over, mainly in upper chest on both sides 2. RADIATION: "Does the pain go anywhere else?" (e.g., into neck, jaw, arms, back)     Radiates upward toward the left shoulder  3. ONSET: "When did the chest pain begin?" (Minutes, hours or days)      Monday afternoon  4. PATTERN "Does the pain come and go, or has it been constant since it started?"  "Does it get worse with exertion?"     Comes and goes  5.  DURATION: "How long does it last" (e.g., seconds, minutes, hours)     20-30 seconds  6. SEVERITY: "How bad is the pain?"  (e.g., Scale 1-10; mild, moderate, or severe)    - MILD (1-3): doesn't interfere with normal activities     - MODERATE (4-7): interferes with normal activities or awakens from sleep    - SEVERE (8-10): excruciating pain, unable to do any normal activities       2/10 7. CARDIAC RISK FACTORS: "Do you have any history of heart problems or risk factors for heart disease?" (e.g., prior heart attack, angina; high blood pressure, diabetes, being overweight, high cholesterol, smoking, or strong family history of heart disease)     Father died of heart attack at age 31; died on Monday; denied DM, smoking, HTN, elev. Cholesterol.  8. PULMONARY RISK FACTORS: "Do you have any history of lung disease?"  (e.g., blood clots in lung, asthma, emphysema, birth control pills)     Asthma 9. CAUSE: "What do you think is causing the chest pain?"     Anxiety and stress with father's recent death 10. OTHER SYMPTOMS: "Do you have any other symptoms?" (e.g., dizziness, nausea, vomiting, sweating, fever, difficulty breathing, cough)  Nauseated but not necessarily associated with the pain, having a hard time getting a deep breath; feels this is anxiety induced; denied pain upon taking a deep breath 11. PREGNANCY: "Is there any chance you are pregnant?" "When was your last menstrual period?"      Started menstrual cycle today  Protocols used: CHEST PAIN-A-AH

## 2018-10-20 ENCOUNTER — Ambulatory Visit: Payer: BC Managed Care – PPO | Admitting: Family Medicine

## 2018-11-25 ENCOUNTER — Encounter (HOSPITAL_COMMUNITY): Payer: Self-pay

## 2018-11-25 ENCOUNTER — Ambulatory Visit (HOSPITAL_COMMUNITY)
Admission: EM | Admit: 2018-11-25 | Discharge: 2018-11-25 | Disposition: A | Discharge status: Home / Self Care | Payer: BC Managed Care – PPO | Attending: Family Medicine | Admitting: Family Medicine

## 2018-11-25 DIAGNOSIS — J209 Acute bronchitis, unspecified: Secondary | ICD-10-CM | POA: Insufficient documentation

## 2018-11-25 MED ORDER — ALBUTEROL SULFATE HFA 108 (90 BASE) MCG/ACT IN AERS
1.0000 | INHALATION_SPRAY | Freq: Four times a day (QID) | RESPIRATORY_TRACT | 0 refills | Status: DC | PRN
Start: 2018-11-25 — End: 2023-01-05

## 2018-11-25 MED ORDER — PREDNISONE 50 MG PO TABS: 50.0000 mg | ORAL_TABLET | Freq: Every day | ORAL | 0 refills | Status: AC

## 2018-11-25 MED ORDER — DOXYCYCLINE HYCLATE 100 MG PO CAPS: 100.0000 mg | ORAL_CAPSULE | Freq: Two times a day (BID) | ORAL | 0 refills | Status: AC

## 2018-11-25 MED ORDER — BENZONATATE 200 MG PO CAPS
200.0000 mg | ORAL_CAPSULE | Freq: Three times a day (TID) | ORAL | 0 refills | Status: AC | PRN
Start: 2018-11-25 — End: 2018-12-02

## 2018-11-25 NOTE — ED Provider Notes (Signed)
[facility]    CSN: [id number] Arrival date & time: 11/25/18  1255     History   Chief Complaint Chief Complaint  Patient presents with  . Appointment  . (1:15)  Sinus Issues & Congestion    HPI Caroline Howe is a 33 y.o. female history of GERD, presenting today for evaluation of URI symptoms.  Patient states that she has had congestion and nasal drainage for the past week.  Of recently she has felt her symptoms may tear her chest and has began to feel a tightness in her chest with associated cough and wheezing.  She is also began to feel feverish and more fatigued over the past 24 to 48 hours.  She has taken some ibuprofen last night.  Also is on daily Zyrtec and Flonase for allergies.  HPI  Past Medical History:  Diagnosis Date  . Anxiety   . GERD (gastroesophageal reflux disease)   . Hypothyroidism   . Postpartum care following cesarean delivery (7/4) 05/01/2016    Patient Active Problem List   Diagnosis Date Noted  . Cesarean delivery delivered 05/01/2016  . Postpartum care following cesarean delivery (7/4) 05/01/2016  . Hypothyroid in pregnancy, antepartum 04/29/2016  . Hypothyroidism 10/04/2013  . GERD (gastroesophageal reflux disease) 10/04/2013    Past Surgical History:  Procedure Laterality Date  . CESAREAN SECTION N/A 04/30/2016   Procedure: CESAREAN SECTION;  Surgeon: Shea Evans, MD;  Location: [facility] BIRTHING SUITES;  Service: Obstetrics;  Laterality: N/A;  . WISDOM TOOTH EXTRACTION      OB History    Gravida  1   Para  1   Term  1   Preterm      AB      Living  1     SAB      TAB      Ectopic      Multiple  0   Live Births  1            Home Medications    Prior to Admission medications   Medication Sig Start Date End Date Taking? Authorizing Provider  albuterol (PROVENTIL HFA;VENTOLIN HFA) 108 (90 Base) MCG/ACT inhaler Inhale 1-2 puffs into the lungs every 6 (six) hours as needed for wheezing or shortness of breath.  11/25/18   Wieters, Hallie C, PA-C  benzonatate (TESSALON) 200 MG capsule Take 1 capsule (200 mg total) by mouth 3 (three) times daily as needed for up to 7 days for cough. 11/25/18 12/02/18  Wieters, Hallie C, PA-C  doxycycline (VIBRAMYCIN) 100 MG capsule Take 1 capsule (100 mg total) by mouth 2 (two) times daily for 10 days. 11/25/18 12/05/18  Wieters, Hallie C, PA-C  fluticasone (FLONASE) 50 MCG/ACT nasal spray Place 1 spray into both nostrils daily. 01/12/18   Myles Lipps, MD  levothyroxine (SYNTHROID, LEVOTHROID) 112 MCG tablet Take 112 mcg by mouth daily before breakfast.    [provider]  norgestimate-ethinyl estradiol (ORTHO-CYCLEN,SPRINTEC,PREVIFEM) 0.25-35 MG-MCG tablet Take 1 tablet by mouth daily.    [provider]  Olopatadine HCl 0.2 % SOLN Apply 1 drop to eye daily. 01/12/18   Myles Lipps, MD  predniSONE (DELTASONE) 50 MG tablet Take 1 tablet (50 mg total) by mouth daily for 5 days. 11/25/18 11/30/18  Wieters, Junius Creamer, PA-C    Family History Family History  Problem Relation Age of Onset  . Diabetes Mother   . Hypertension Father   . Liver cancer Maternal Grandfather   . Colon cancer Paternal  Grandfather     Social History Social History   Tobacco Use  . Smoking status: Never Smoker  . Smokeless tobacco: Never Used  Substance Use Topics  . Alcohol use: Yes    Comment: occasional  . Drug use: No     Allergies   Patient has no known allergies.   Review of Systems Review of Systems  Constitutional: Positive for chills, fatigue and fever. Negative for activity change and appetite change.  HENT: Positive for congestion, rhinorrhea and sinus pressure. Negative for ear pain, sore throat and trouble swallowing.   Eyes: Negative for discharge and redness.  Respiratory: Positive for cough, chest tightness and wheezing. Negative for shortness of breath.   Cardiovascular: Negative for chest pain.  Gastrointestinal: Negative for abdominal pain,  diarrhea, nausea and vomiting.  Musculoskeletal: Negative for myalgias.  Skin: Negative for rash.  Neurological: Negative for dizziness, light-headedness and headaches.     Physical Exam Triage Vital Signs ED Triage Vitals [11/25/18 1308]  Enc Vitals Group     BP (!) 154/99     Pulse Rate (!) 133     Resp 20     Temp 99.8 F (37.7 C)     Temp Source Oral     SpO2 97 %     Weight      Height      Head Circumference      Peak Flow      Pain Score 6     Pain Loc      Pain Edu?      Excl. in GC?    No data found.  Updated Vital Signs BP (!) 154/99 (BP Location: Left Arm)   Pulse (!) 133   Temp 99.8 F (37.7 C) (Oral)   Resp 20   SpO2 97%   Visual Acuity Right Eye Distance:   Left Eye Distance:   Bilateral Distance:    Right Eye Near:   Left Eye Near:    Bilateral Near:     Physical Exam Vitals signs and nursing note reviewed.  Constitutional:      General: She is not in acute distress.    Appearance: She is well-developed.  HENT:     Head: Normocephalic and atraumatic.     Ears:     Comments: Bilateral ears without tenderness to palpation of external auricle, tragus and mastoid, EAC's without erythema or swelling, TM's with good bony landmarks and cone of light. Non erythematous.    Nose:     Comments: Nasal mucosa erythematous    Mouth/Throat:     Comments: Oral mucosa pink and moist, no tonsillar enlargement or exudate. Posterior pharynx patent and nonerythematous, no uvula deviation or swelling. Normal phonation. Eyes:     Conjunctiva/sclera: Conjunctivae normal.  Neck:     Musculoskeletal: Neck supple.  Cardiovascular:     Rate and Rhythm: Regular rhythm. Tachycardia present.     Heart sounds: No murmur.  Pulmonary:     Effort: Pulmonary effort is normal. No respiratory distress.     Breath sounds: Normal breath sounds.     Comments: Breathing comfortably at rest, CTABL.  Wheezing present with bronchospasm, cough triggered by deep inspiration.  No  rales auscultated. Abdominal:     Palpations: Abdomen is soft.     Tenderness: There is no abdominal tenderness.  Skin:    General: Skin is warm and dry.  Neurological:     Mental Status: She is alert.      UC Treatments /  Results  Labs (all labs ordered are listed, but only abnormal results are displayed) Labs Reviewed - No data to display  EKG None  Radiology No results found.  Procedures Procedures (including critical care time)  Medications Ordered in UC Medications - No data to display  Initial Impression / Assessment and Plan / UC Course  I have reviewed the triage vital signs and the nursing notes.  Pertinent labs & imaging results that were available during my care of the patient were reviewed by me and considered in my medical decision making (see chart for details).     Patient with URI symptoms greater than 1 week, recent worsening with fever, tachycardia, will treat for bronchitis given wheezing.  Will cover with doxycycline to also cover sinusitis.  Prednisone daily for 5 days.  Albuterol inhaler as needed.  Tylenol ibuprofen for any fevers or body aches.  Continue to rest, push fluids,Discussed strict return precautions. Patient verbalized understanding and is agreeable with plan.  Final Clinical Impressions(s) / UC Diagnoses   Final diagnoses:  Acute bronchitis, unspecified organism     Discharge Instructions     Please begin taking doxycycline twice daily for the next 10 days, this will cover infection in sinuses as well as atypical respiratory illnesses in the lungs Begin prednisone daily for the next 5 days Please use albuterol inhaler as needed for shortness of breath and wheezing, chest tightness Tessalon every 8 hours as needed for cough Alternate Tylenol and ibuprofen every 4 hours for control of fever and with any body aches  Follow-up if symptoms are not resolving with the above treatment, worsening, developing increased shortness of breath  or difficulty breathing   ED Prescriptions    Medication Sig Dispense Auth. Provider   doxycycline (VIBRAMYCIN) 100 MG capsule Take 1 capsule (100 mg total) by mouth 2 (two) times daily for 10 days. 20 capsule Wieters, Hallie C, PA-C   predniSONE (DELTASONE) 50 MG tablet Take 1 tablet (50 mg total) by mouth daily for 5 days. 5 tablet Wieters, Hallie C, PA-C   benzonatate (TESSALON) 200 MG capsule Take 1 capsule (200 mg total) by mouth 3 (three) times daily as needed for up to 7 days for cough. 28 capsule Wieters, Hallie C, PA-C   albuterol (PROVENTIL HFA;VENTOLIN HFA) 108 (90 Base) MCG/ACT inhaler Inhale 1-2 puffs into the lungs every 6 (six) hours as needed for wheezing or shortness of breath. 1 Inhaler Wieters, Hallie C, PA-C     Controlled Substance Prescriptions [place] Controlled Substance Registry consulted? Not Applicable   Lew DawesWieters, Hallie C, [place]PA-C 11/25/18 1413

## 2018-11-25 NOTE — Discharge Instructions (Addendum)
Please begin taking doxycycline twice daily for the next 10 days, this will cover infection in sinuses as well as atypical respiratory illnesses in the lungs Begin prednisone daily for the next 5 days Please use albuterol inhaler as needed for shortness of breath and wheezing, chest tightness Tessalon every 8 hours as needed for cough Alternate Tylenol and ibuprofen every 4 hours for control of fever and with any body aches  Follow-up if symptoms are not resolving with the above treatment, worsening, developing increased shortness of breath or difficulty breathing

## 2018-11-25 NOTE — ED Triage Notes (Signed)
Pt presents with sinus/facial pressure and pain; nasal drainage, cough, and chest congestion X 5 days.

## 2019-05-15 IMAGING — CR DG CHEST 2V
2 series · 2 of 2 positions shown · non-contrast
Comparison: 08/07/2009

CLINICAL DATA: Pt c/o nausea x 3 days ago and fever onset x approx
1 week ago. Also had episode of hypotension today while at PCP. Pt
nondiabetic. NonHTN. Denies any chest history. Nonsmoker.

EXAM:
CHEST  2 VIEW

[w chest lat]
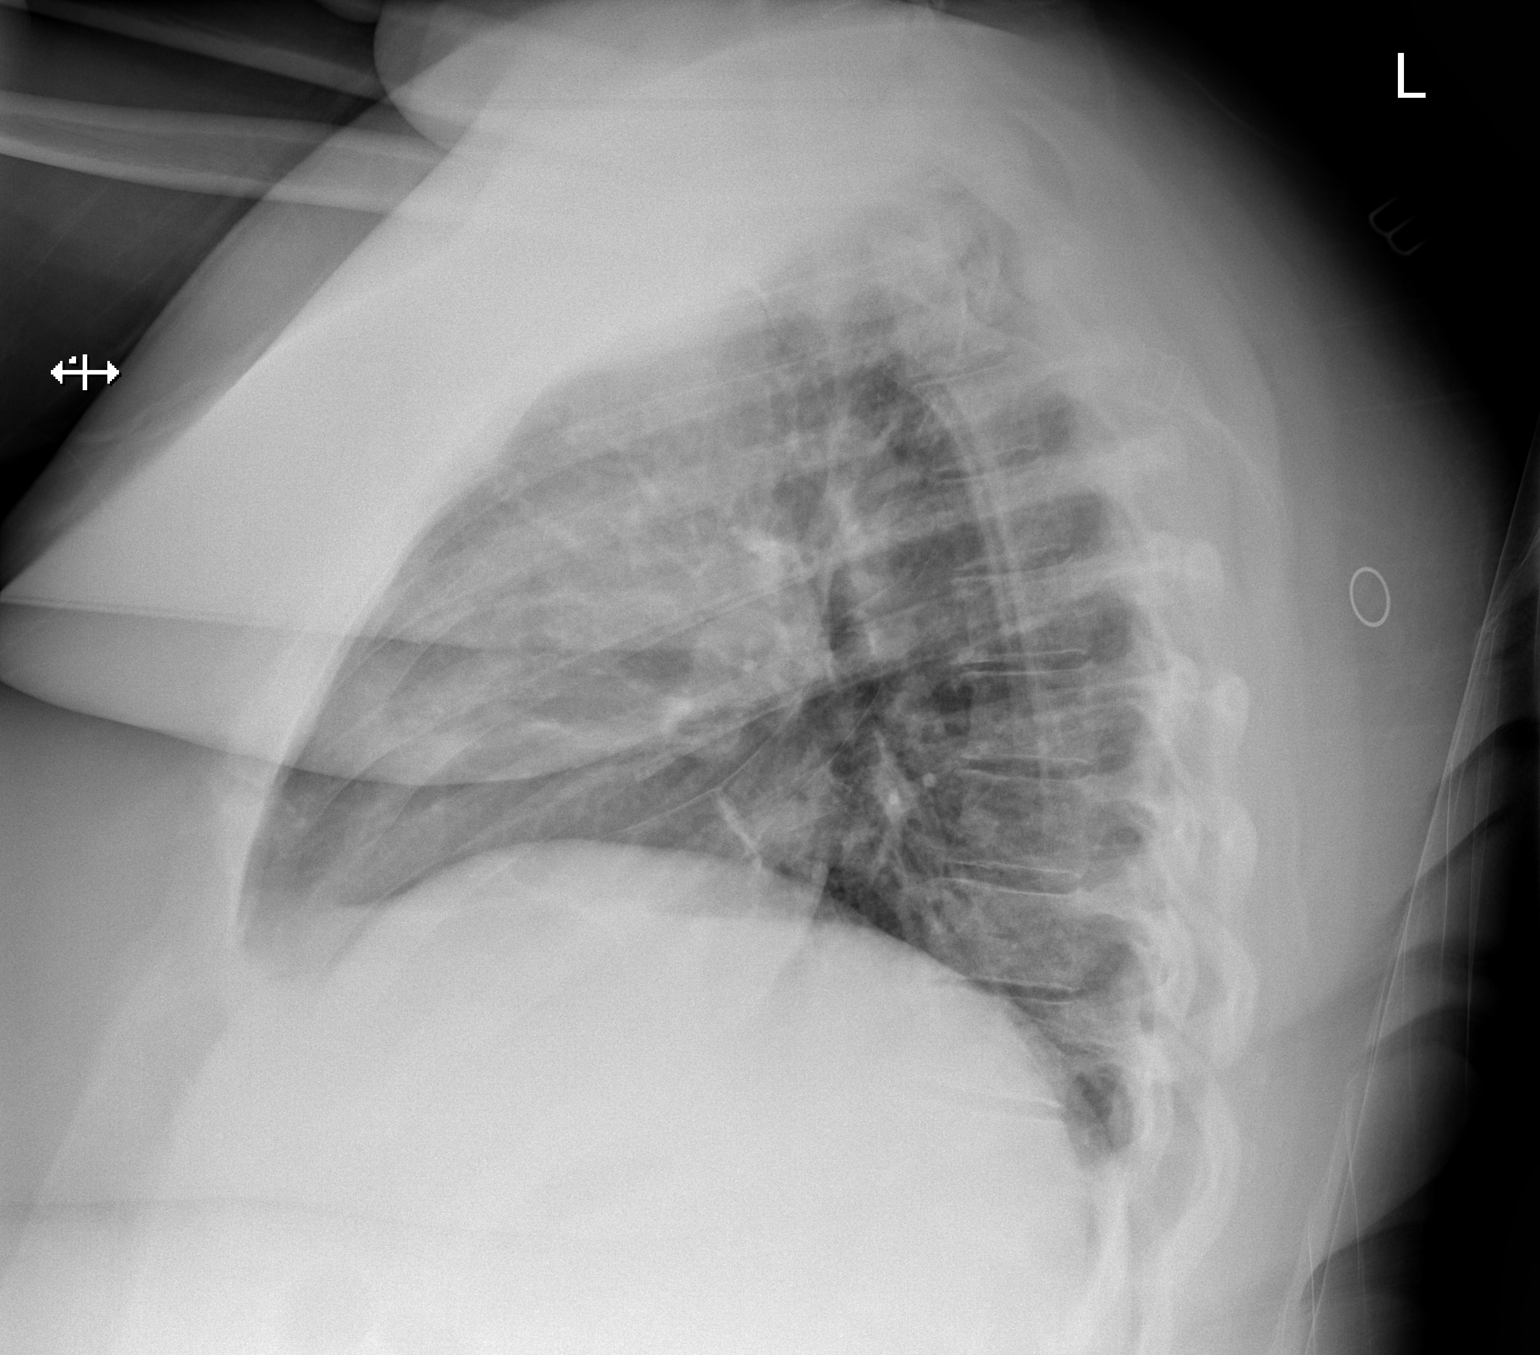

[x chest ap]
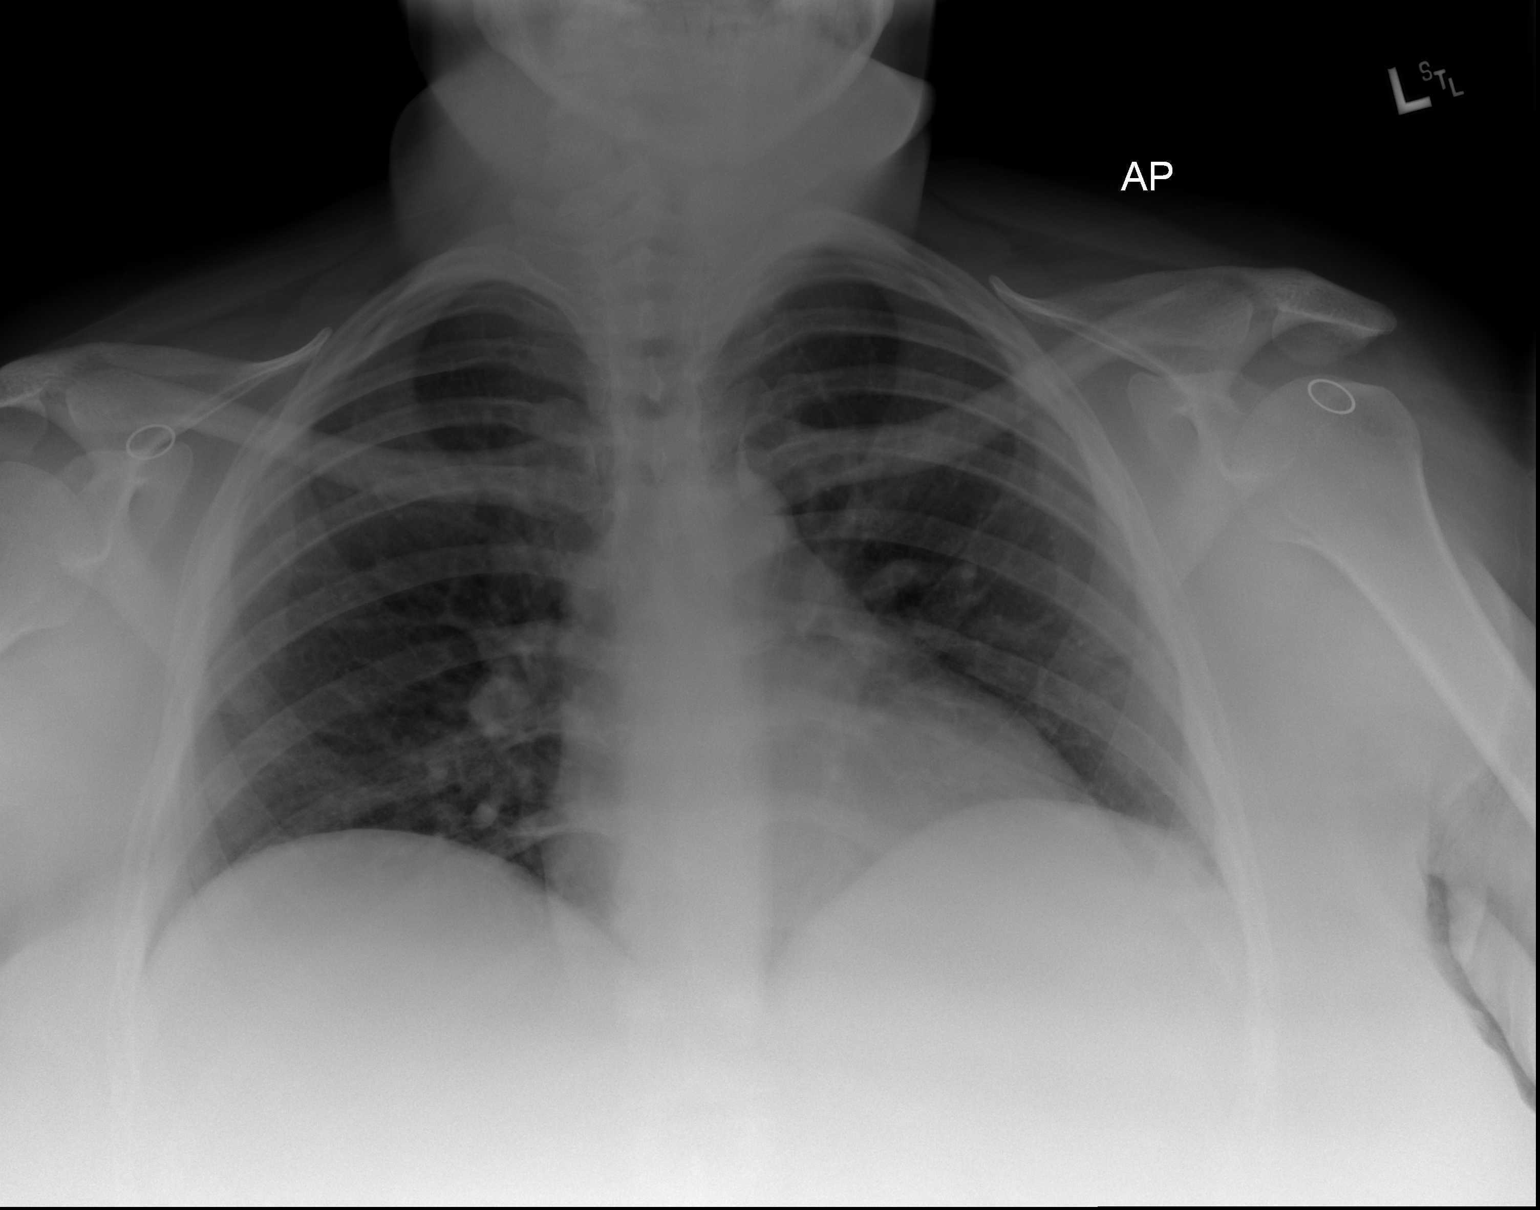

[2 of 2 positions shown; findings below may reference images not displayed]

FINDINGS: The heart size and mediastinal contours are within normal limits.
Both lungs are clear. No pleural effusion or pneumothorax. The
visualized skeletal structures are unremarkable.
IMPRESSION: No active cardiopulmonary disease.

## 2019-07-19 ENCOUNTER — Other Ambulatory Visit: Payer: Self-pay | Admitting: *Deleted

## 2019-07-19 DIAGNOSIS — Z20822 Contact with and (suspected) exposure to covid-19: Secondary | ICD-10-CM

## 2019-07-21 ENCOUNTER — Telehealth: Payer: Self-pay | Admitting: Family Medicine

## 2019-07-21 LAB — NOVEL CORONAVIRUS, NAA: SARS-CoV-2, NAA: NOT DETECTED

## 2019-07-21 NOTE — Telephone Encounter (Signed)
Negative COVID results given. Patient results "NOT Detected." Caller expressed understanding. ° °

## 2019-12-26 ENCOUNTER — Ambulatory Visit: Payer: BC Managed Care – PPO | Attending: Internal Medicine

## 2019-12-26 DIAGNOSIS — Z23 Encounter for immunization: Secondary | ICD-10-CM | POA: Insufficient documentation

## 2019-12-26 NOTE — Progress Notes (Signed)
   Covid-19 Vaccination Clinic  Name:  Malayasia Mirkin    MRN: [id number] DOB: [birth year]  12/26/2019  Ms. Charette was observed post Covid-19 immunization for 15 minutes without incidence. She was provided with Vaccine Information Sheet and instruction to access the V-Safe system.   Ms. Cochrane was instructed to call 911 with any severe reactions post vaccine: Marland Kitchen Difficulty breathing  . Swelling of your face and throat  . A fast heartbeat  . A bad rash all over your body  . Dizziness and weakness    Immunizations Administered    Name Date Dose VIS Date Route   Pfizer COVID-19 Vaccine 12/26/2019 11:45 AM 0.3 mL 10/08/2019 Intramuscular   Manufacturer: [employer], [employer]   Lot: [id number]   NDC: [address]-6

## 2020-01-18 ENCOUNTER — Ambulatory Visit: Payer: BC Managed Care – PPO | Attending: Internal Medicine

## 2020-01-18 DIAGNOSIS — Z23 Encounter for immunization: Secondary | ICD-10-CM

## 2020-01-18 NOTE — Progress Notes (Signed)
   Covid-19 Vaccination Clinic  Name:  Lexia Vandevender    MRN: [id number] DOB: [birth year]  01/18/2020  Ms. Eckhart was observed post Covid-19 immunization for 15 minutes without incident. She was provided with Vaccine Information Sheet and instruction to access the V-Safe system.   Ms. Hoganson was instructed to call 911 with any severe reactions post vaccine: Marland Kitchen Difficulty breathing  . Swelling of face and throat  . A fast heartbeat  . A bad rash all over body  . Dizziness and weakness   Immunizations Administered    Name Date Dose VIS Date Route   Pfizer COVID-19 Vaccine 01/18/2020  3:14 PM 0.3 mL 10/08/2019 Intramuscular   Manufacturer: [employer], [employer]   Lot: [id number]   NDC: [address]-7

## 2021-01-18 ENCOUNTER — Ambulatory Visit
Admission: EM | Admit: 2021-01-18 | Discharge: 2021-01-18 | Disposition: A | Discharge status: Home / Self Care | Payer: BC Managed Care – PPO | Attending: Family Medicine | Admitting: Family Medicine

## 2021-01-18 ENCOUNTER — Other Ambulatory Visit: Payer: Self-pay

## 2021-01-18 DIAGNOSIS — A084 Viral intestinal infection, unspecified: Secondary | ICD-10-CM

## 2021-01-18 MED ORDER — ONDANSETRON 4 MG PO TBDP: 4.0000 mg | ORAL_TABLET | Freq: Three times a day (TID) | ORAL | 0 refills | Status: AC | PRN

## 2021-01-18 NOTE — ED Provider Notes (Signed)
[facility]    CSN: [id number] Arrival date & time: 01/18/21  3419      History   Chief Complaint Chief Complaint  Patient presents with  . Diarrhea    HPI Caroline Howe is a 35 y.o. female.   Patient is a 35 year old female presents today with nausea, vomiting and diarrhea since Tuesday.  Symptoms have been somewhat resolving.  Last vomiting episode was yesterday.  Still having some diarrhea.  Described as watery and nonbloody.  She is a Midwife.  Low-grade fever.  No recent traveling.  Has been doing Imodium without much relief.  Is able to hold down water and Gatorade.  Feels weak.     Past Medical History:  Diagnosis Date  . Anxiety   . GERD (gastroesophageal reflux disease)   . Hypothyroidism   . Postpartum care following cesarean delivery (7/4) 05/01/2016    Patient Active Problem List   Diagnosis Date Noted  . Cesarean delivery delivered 05/01/2016  . Postpartum care following cesarean delivery (7/4) 05/01/2016  . Hypothyroid in pregnancy, antepartum 04/29/2016  . Hypothyroidism 10/04/2013  . GERD (gastroesophageal reflux disease) 10/04/2013    Past Surgical History:  Procedure Laterality Date  . CESAREAN SECTION N/A 04/30/2016   Procedure: CESAREAN SECTION;  Surgeon: Shea Evans, MD;  Location: [facility]THING SUIT[facility];  Service: Obstetrics;  Laterality: N/A;  . WISDOM TOOTH EXTRACTION      OB History    Gravida  1   Para  1   Term  1   Preterm      AB      Living  1     SAB      IAB      Ectopic      Multiple  0   Live Births  1            Home Medications    Prior to Admission medications   Medication Sig Start Date End Date Taking? Authorizing Provider  ondansetron (ZOFRAN ODT) 4 MG disintegrating tablet Take 1 tablet (4 mg total) by mouth every 8 (eight) hours as needed for nausea or vomiting. 01/18/21  Yes Ayce Pietrzyk A, NP  albuterol (PROVENTIL HFA;VENTOLIN HFA) 108 (90 Base) MCG/ACT inhaler Inhale 1-2 puffs  into the lungs every 6 (six) hours as needed for wheezing or shortness of breath. 11/25/18   Wieters, Hallie C, PA-C  fluticasone (FLONASE) 50 MCG/ACT nasal spray Place 1 spray into both nostrils daily. 01/12/18   Lezlie Lye, Meda Coffee, MD  levothyroxine (SYNTHROID, LEVOTHROID) 112 MCG tablet Take 112 mcg by mouth daily before breakfast.    [provider]  norgestimate-ethinyl estradiol (ORTHO-CYCLEN,SPRINTEC,PREVIFEM) 0.25-35 MG-MCG tablet Take 1 tablet by mouth daily.    [provider]  Olopatadine HCl 0.2 % SOLN Apply 1 drop to eye daily. 01/12/18   Noni Saupe, MD    Family History Family History  Problem Relation Age of Onset  . Diabetes Mother   . Hypertension Father   . Liver cancer Maternal Grandfather   . Colon cancer Paternal Grandfather     Social History Social History   Tobacco Use  . Smoking status: Never Smoker  . Smokeless tobacco: Never Used  Substance Use Topics  . Alcohol use: Yes    Comment: occasional  . Drug use: No     Allergies   Patient has no known allergies.   Review of Systems Review of Systems   Physical Exam Triage Vital Signs ED Triage Vitals  Enc Vitals Group     BP 01/18/21 0817 119/85     Pulse Rate 01/18/21 0817 89     Resp 01/18/21 0817 18     Temp 01/18/21 0817 99 F (37.2 C)     Temp Source 01/18/21 0817 Oral     SpO2 01/18/21 0817 98 %     Weight 01/18/21 0818 183 lb (83 kg)     Height 01/18/21 0818 5\' 4"  (1.626 m)     Head Circumference --      Peak Flow --      Pain Score 01/18/21 0818 0     Pain Loc --      Pain Edu? --      Excl. in GC? --    No data found.  Updated Vital Signs BP 119/85 (BP Location: Left Arm)   Pulse 89   Temp 99 F (37.2 C) (Oral)   Resp 18   Ht 5\' 4"  (1.626 m)   Wt 183 lb (83 kg)   LMP 01/04/2021   SpO2 98%   BMI 31.41 kg/m   Visual Acuity Right Eye Distance:   Left Eye Distance:   Bilateral Distance:    Right Eye Near:   Left Eye Near:    Bilateral  Near:     Physical Exam Vitals and nursing note reviewed.  Constitutional:      General: She is not in acute distress.    Appearance: Normal appearance. She is not ill-appearing, toxic-appearing or diaphoretic.  HENT:     Head: Normocephalic.  Eyes:     Conjunctiva/sclera: Conjunctivae normal.  Pulmonary:     Effort: Pulmonary effort is normal.  Musculoskeletal:        General: Normal range of motion.     Cervical back: Normal range of motion.  Skin:    General: Skin is warm and dry.     Findings: No rash.  Neurological:     Mental Status: She is alert.  Psychiatric:        Mood and Affect: Mood normal.      UC Treatments / Results  Labs (all labs ordered are listed, but only abnormal results are displayed) Labs Reviewed - No data to display  EKG   Radiology No results found.  Procedures Procedures (including critical care time)  Medications Ordered in UC Medications - No data to display  Initial Impression / Assessment and Plan / UC Course  I have reviewed the triage vital signs and the nursing notes.  Pertinent labs & imaging results that were available during my care of the patient were reviewed by me and considered in my medical decision making (see chart for details).     Viral gastroenteritis Zofran as needed for nausea, vomiting.  Recommend push fluids to include Gatorade, water or Pedialyte.  Pepto over-the-counter as needed.  Bland diet as tolerated. Follow up as needed for continued or worsening symptoms'  Final Clinical Impressions(s) / UC Diagnoses   Final diagnoses:  Viral gastroenteritis     Discharge Instructions     This is most likely viral gastroenteritis or stomach bug. Make sure you are staying hydrated with Gatorade, Pedialyte and water.  I have given you Zofran to use as needed for nausea, vomiting.  You can try Pepto over-the-counter If you can tolerate it I recommend broths or bland foods such as crackers, bananas, applesauce  or rice. Follow up as needed for continued or worsening symptoms     ED Prescriptions  Medication Sig Dispense Auth. Provider   ondansetron (ZOFRAN ODT) 4 MG disintegrating tablet Take 1 tablet (4 mg total) by mouth every 8 (eight) hours as needed for nausea or vomiting. 20 tablet Dahlia Byes A, NP     PDMP not reviewed this encounter.   Janace Aris, NP 01/18/21 [phone]

## 2021-01-18 NOTE — Discharge Instructions (Addendum)
This is most likely viral gastroenteritis or stomach bug. Make sure you are staying hydrated with Gatorade, Pedialyte and water.  I have given you Zofran to use as needed for nausea, vomiting.  You can try Pepto over-the-counter If you can tolerate it I recommend broths or bland foods such as crackers, bananas, applesauce or rice. Follow up as needed for continued or worsening symptoms

## 2021-01-18 NOTE — ED Triage Notes (Signed)
Pt reports having n/v/d x3 days.

## 2021-09-06 ENCOUNTER — Other Ambulatory Visit: Payer: Self-pay

## 2021-09-06 ENCOUNTER — Ambulatory Visit
Admission: EM | Admit: 2021-09-06 | Discharge: 2021-09-06 | Disposition: A | Discharge status: Home / Self Care | Payer: BC Managed Care – PPO | Attending: Emergency Medicine | Admitting: Emergency Medicine

## 2021-09-06 ENCOUNTER — Encounter: Payer: Self-pay | Admitting: Emergency Medicine

## 2021-09-06 DIAGNOSIS — R1030 Lower abdominal pain, unspecified: Secondary | ICD-10-CM | POA: Diagnosis present

## 2021-09-06 DIAGNOSIS — R030 Elevated blood-pressure reading, without diagnosis of hypertension: Secondary | ICD-10-CM | POA: Diagnosis present

## 2021-09-06 DIAGNOSIS — N898 Other specified noninflammatory disorders of vagina: Secondary | ICD-10-CM | POA: Insufficient documentation

## 2021-09-06 DIAGNOSIS — R300 Dysuria: Secondary | ICD-10-CM | POA: Insufficient documentation

## 2021-09-06 LAB — POCT URINALYSIS DIP (MANUAL ENTRY)
Bilirubin, UA: NEGATIVE
Glucose, UA: NEGATIVE mg/dL
Ketones, POC UA: NEGATIVE mg/dL
Nitrite, UA: NEGATIVE
Protein Ur, POC: 100 mg/dL — AB
Spec Grav, UA: 1.025 (ref 1.010–1.025)
Urobilinogen, UA: 1 E.U./dL
pH, UA: 7 (ref 5.0–8.0)

## 2021-09-06 MED ORDER — FLUCONAZOLE 150 MG PO TABS: 150.0000 mg | ORAL_TABLET | Freq: Every day | ORAL | 0 refills | Status: DC

## 2021-09-06 MED ORDER — CEPHALEXIN 500 MG PO CAPS: 500.0000 mg | ORAL_CAPSULE | Freq: Two times a day (BID) | ORAL | 0 refills | Status: AC

## 2021-09-06 NOTE — Discharge Instructions (Addendum)
Take the antibiotic as directed.  The urine culture is pending.  We will call you if it shows the need to change or discontinue your antibiotic.    Take the Diflucan as directed.  Your vaginal tests are pending.  If your test results are positive, we will call you.  Do not have sexual activity for at least 7 days.    Follow-up with your primary care provider or gynecologist if your symptoms are not improving.    Your blood pressure is elevated today at 136/99.  Please have this rechecked by your primary care provider in 2-4 weeks.

## 2021-09-06 NOTE — ED Triage Notes (Signed)
Pt here with suprapubic pressure, burning on urination, white vaginal discharge and irritation

## 2021-09-06 NOTE — ED Provider Notes (Signed)
Renaldo Fiddler    CSN: [id number] Arrival date & time: 09/06/21  1019      History   Chief Complaint Chief Complaint  Patient presents with   Dysuria   Vaginal Itching   Vaginal Discharge    HPI Caroline Howe is a 35 y.o. female.  Patient presents with 4 day history of dysuria, lower abdominal pressure, white vaginal discharge, vaginal irritation.  She denies fever, chills, flank pain, pelvic pain, or other symptoms.  She states the white vaginal discharge is similar to previous episodes of yeast infection.  Treatment attempted at home with OTC Monistat.  Patient also reports history of frequent UTIs.  LMP: Current.  The history is provided by the patient and medical records.   Past Medical History:  Diagnosis Date   Anxiety    GERD (gastroesophageal reflux disease)    Hypothyroidism    Postpartum care following cesarean delivery (7/4) 05/01/2016    Patient Active Problem List   Diagnosis Date Noted   Cesarean delivery delivered 05/01/2016   Postpartum care following cesarean delivery (7/4) 05/01/2016   Hypothyroid in pregnancy, antepartum 04/29/2016   Hypothyroidism 10/04/2013   GERD (gastroesophageal reflux disease) 10/04/2013    Past Surgical History:  Procedure Laterality Date   CESAREAN SECTION N/A 04/30/2016   Procedure: CESAREAN SECTION;  Surgeon: Shea Evans, MD;  Location: [facility];  Service: Obstetrics;  Laterality: N/A;   WISDOM TOOTH EXTRACTION      OB History     Gravida  1   Para  1   Term  1   Preterm      AB      Living  1      SAB      IAB      Ectopic      Multiple  0   Live Births  1            Home Medications    Prior to Admission medications   Medication Sig Start Date End Date Taking? Authorizing Provider  cephALEXin (KEFLEX) 500 MG capsule Take 1 capsule (500 mg total) by mouth 2 (two) times daily for 5 days. 09/06/21 09/11/21 Yes Mickie Bail, NP  fluconazole (DIFLUCAN) 150 MG tablet Take 1  tablet (150 mg total) by mouth daily. Take one tablet today.  May repeat in 3 days. 09/06/21  Yes Mickie Bail, NP  albuterol (PROVENTIL HFA;VENTOLIN HFA) 108 (90 Base) MCG/ACT inhaler Inhale 1-2 puffs into the lungs every 6 (six) hours as needed for wheezing or shortness of breath. 11/25/18   Wieters, Hallie C, PA-C  fluticasone (FLONASE) 50 MCG/ACT nasal spray Place 1 spray into both nostrils daily. 01/12/18   Lezlie Lye, Meda Coffee, MD  levothyroxine (SYNTHROID, LEVOTHROID) 112 MCG tablet Take 112 mcg by mouth daily before breakfast.    [provider]  norgestimate-ethinyl estradiol (ORTHO-CYCLEN,SPRINTEC,PREVIFEM) 0.25-35 MG-MCG tablet Take 1 tablet by mouth daily.    [provider]  Olopatadine HCl 0.2 % SOLN Apply 1 drop to eye daily. 01/12/18   Noni Saupe, MD  ondansetron (ZOFRAN ODT) 4 MG disintegrating tablet Take 1 tablet (4 mg total) by mouth every 8 (eight) hours as needed for nausea or vomiting. 01/18/21   Janace Aris, NP    Family History Family History  Problem Relation Age of Onset   Diabetes Mother    Hypertension Father    Liver cancer Maternal Grandfather    Colon cancer Paternal Grandfather  Social History Social History   Tobacco Use   Smoking status: Never   Smokeless tobacco: Never  Substance Use Topics   Alcohol use: Yes    Comment: occasional   Drug use: No     Allergies   Patient has no known allergies.   Review of Systems Review of Systems  Constitutional:  Negative for chills and fever.  Respiratory:  Negative for cough and shortness of breath.   Cardiovascular:  Negative for chest pain and palpitations.  Gastrointestinal:  Positive for abdominal pain. Negative for constipation, diarrhea and vomiting.  Genitourinary:  Positive for dysuria and vaginal discharge. Negative for flank pain, hematuria and pelvic pain.  Skin:  Negative for color change and rash.  All other systems reviewed and are negative.   Physical  Exam Triage Vital Signs ED Triage Vitals  Enc Vitals Group     BP      Pulse      Resp      Temp      Temp src      SpO2      Weight      Height      Head Circumference      Peak Flow      Pain Score      Pain Loc      Pain Edu?      Excl. in [place]?    No data found.  Updated Vital Signs BP (!) 136/99   Pulse 88   Temp 98.6 F (37 C)   Resp 20   LMP 09/05/2021 (Approximate)   SpO2 98%   Visual Acuity Right Eye Distance:   Left Eye Distance:   Bilateral Distance:    Right Eye Near:   Left Eye Near:    Bilateral Near:     Physical Exam Vitals and nursing note reviewed.  Constitutional:      General: She is not in acute distress.    Appearance: She is well-developed. She is not ill-appearing.  HENT:     Head: Normocephalic and atraumatic.     Mouth/Throat:     Mouth: Mucous membranes are moist.  Eyes:     Conjunctiva/sclera: Conjunctivae normal.  Cardiovascular:     Rate and Rhythm: Normal rate and regular rhythm.     Heart sounds: Normal heart sounds.  Pulmonary:     Effort: Pulmonary effort is normal. No respiratory distress.     Breath sounds: Normal breath sounds.  Abdominal:     General: Bowel sounds are normal.     Palpations: Abdomen is soft.     Tenderness: There is abdominal tenderness in the right lower quadrant and left lower quadrant. There is no right CVA tenderness, left CVA tenderness, guarding or rebound.     Comments: Mild tenderness to palpation of lower abdomen.  No rebound or guarding.  Musculoskeletal:     Cervical back: Neck supple.  Skin:    General: Skin is warm and dry.  Neurological:     Mental Status: She is alert.  Psychiatric:        Mood and Affect: Mood normal.        Behavior: Behavior normal.     UC Treatments / Results  Labs (all labs ordered are listed, but only abnormal results are displayed) Labs Reviewed  POCT URINALYSIS DIP (MANUAL ENTRY) - Abnormal; Notable for the following components:      Result Value    Color, UA other (*)    Blood, UA large (*)  Protein Ur, POC =100 (*)    Leukocytes, UA Moderate (2+) (*)    All other components within normal limits  URINE CULTURE  CERVICOVAGINAL ANCILLARY ONLY    EKG   Radiology No results found.  Procedures Procedures (including critical care time)  Medications Ordered in UC Medications - No data to display  Initial Impression / Assessment and Plan / UC Course  I have reviewed the triage vital signs and the nursing notes.  Pertinent labs & imaging results that were available during my care of the patient were reviewed by me and considered in my medical decision making (see chart for details).  Dysuria, lower abdominal pain, vaginal discharge, elevated blood pressure reading.  Treating with Keflex. Urine culture pending. Discussed with patient that we will call her if the urine culture shows the need to change or discontinue the antibiotic. Patient obtained vaginal self swab for testing.  Treating with Diflucan because patient reports the vaginal discharge is similar to previous episodes of yeast infection.  Discussed with patient that she may require additional treatment if her test results are positive.  Instructed her to abstain from sexual activity for at least 7 days.  Instructed her to follow-up with her PCP or OB/GYN if her symptoms are not improving.  Also discussed with patient that her blood pressure is elevated today and needs to be rechecked by her PCP in 2 to 4 weeks.  She agrees to plan of care.       Final Clinical Impressions(s) / UC Diagnoses   Final diagnoses:  Dysuria  Lower abdominal pain  Vaginal discharge  Elevated blood pressure reading     Discharge Instructions      Take the antibiotic as directed.  The urine culture is pending.  We will call you if it shows the need to change or discontinue your antibiotic.    Take the Diflucan as directed.  Your vaginal tests are pending.  If your test results are  positive, we will call you.  Do not have sexual activity for at least 7 days.    Follow-up with your primary care provider or gynecologist if your symptoms are not improving.    Your blood pressure is elevated today at 136/99.  Please have this rechecked by your primary care provider in 2-4 weeks.          ED Prescriptions     Medication Sig Dispense Auth. Provider   fluconazole (DIFLUCAN) 150 MG tablet Take 1 tablet (150 mg total) by mouth daily. Take one tablet today.  May repeat in 3 days. 2 tablet Sharion Balloon, NP   cephALEXin (KEFLEX) 500 MG capsule Take 1 capsule (500 mg total) by mouth 2 (two) times daily for 5 days. 10 capsule Sharion Balloon, NP      PDMP not reviewed this encounter.   Sharion Balloon, NP 09/06/21 1122

## 2021-09-07 LAB — CERVICOVAGINAL ANCILLARY ONLY
Bacterial Vaginitis (gardnerella): NEGATIVE
Candida Glabrata: NEGATIVE
Candida Vaginitis: NEGATIVE
Chlamydia: NEGATIVE
Comment: NEGATIVE
Comment: NEGATIVE
Comment: NEGATIVE
Comment: NEGATIVE
Comment: NEGATIVE
Comment: NORMAL
Neisseria Gonorrhea: NEGATIVE
Trichomonas: NEGATIVE

## 2021-09-10 LAB — URINE CULTURE: Culture: >=100000 — AB

## 2023-01-05 ENCOUNTER — Ambulatory Visit
Admission: RE | Admit: 2023-01-05 | Discharge: 2023-01-05 | Disposition: A | Discharge status: Home / Self Care | Payer: BC Managed Care – PPO | Source: Ambulatory Visit | Attending: Family Medicine | Admitting: Family Medicine

## 2023-01-05 VITALS — BP 139/108 | HR 104 | Temp 99.0°F | Resp 16 | Ht 64.0 in | Wt 221.0 lb

## 2023-01-05 DIAGNOSIS — J111 Influenza due to unidentified influenza virus with other respiratory manifestations: Secondary | ICD-10-CM

## 2023-01-05 MED ORDER — BENZONATATE 100 MG PO CAPS: 200.0000 mg | ORAL_CAPSULE | Freq: Three times a day (TID) | ORAL | 0 refills | Status: DC | PRN

## 2023-01-05 MED ORDER — ALBUTEROL SULFATE HFA 108 (90 BASE) MCG/ACT IN AERS: 1.0000 | INHALATION_SPRAY | Freq: Four times a day (QID) | RESPIRATORY_TRACT | 0 refills | Status: AC | PRN

## 2023-01-05 NOTE — ED Triage Notes (Signed)
Pt c/o cough,congestion, V/D x5 days. Denies any abd pain or fevers. Denies any episodes of emesis today, son had similar sx's.

## 2023-01-05 NOTE — Discharge Instructions (Signed)
We will contact you if your COVID/influenza test is positive.  Please quarantine while you wait for the results.  If your test is negative you may resume normal activities.  If your test is positive please continue to quarantine for at least 5 days from your symptom onset or until you are without a fever for at least 24 hours after the medications.    If your were prescribed medication, stop by the pharmacy to pick them up.   You can take Tylenol and/or Ibuprofen as needed for fever reduction and pain relief.    For cough: honey 1/2 to 1 teaspoon (you can dilute the honey in water or another fluid).  You can also use guaifenesin and dextromethorphan for cough. You can use a humidifier for chest congestion and cough.  If you don't have a humidifier, you can sit in the bathroom with the hot shower running.      For sore throat: try warm salt water gargles, Mucinex sore throat cough drops or cepacol lozenges, throat spray, warm tea or water with lemon/honey, popsicles or ice, or OTC cold relief medicine for throat discomfort. You can also purchase chloraseptic spray at the pharmacy or dollar store.   For congestion: take a daily anti-histamine like Zyrtec, Claritin, and a oral decongestant, such as pseudoephedrine.  You can also use Flonase 1-2 sprays in each nostril daily. Afrin is also a good option, if you do not have high blood pressure.    It is important to stay hydrated: drink plenty of fluids (water, gatorade/powerade/pedialyte, juices, or teas) to keep your throat moisturized and help further relieve irritation/discomfort.    Return or go to the Emergency Department if symptoms worsen or do not improve in the next few days  

## 2023-01-05 NOTE — ED Provider Notes (Signed)
[facility]    CSN: [id number] Arrival date & time: 01/05/23  [id number]      History   Chief Complaint Chief Complaint  Patient presents with   Cough    Appt   Nasal Congestion   Emesis   Diarrhea    HPI Caroline Howe is a 37 y.o. female.   HPI   Caroline Howe presents for persistent respiratory symptoms.  She had sinus issues on Tuesday.  Vomiting and diarrhea started 2 days later and have since resolved. "Feels like glue on her chest", nasal congestion, rhinorrhea and cough.  Took some OTC meds with some relief. Home COVID test was negative.  Her son has similar sx.       Past Medical History:  Diagnosis Date   Anxiety    GERD (gastroesophageal reflux disease)    Hypothyroidism    Postpartum care following cesarean delivery (7/4) 05/01/2016    Patient Active Problem List   Diagnosis Date Noted   Cesarean delivery delivered 05/01/2016   Postpartum care following cesarean delivery (7/4) 05/01/2016   Hypothyroid in pregnancy, antepartum 04/29/2016   Hypothyroidism 10/04/2013   GERD (gastroesophageal reflux disease) 10/04/2013    Past Surgical History:  Procedure Laterality Date   CESAREAN SECTION N/A 04/30/2016   Procedure: CESAREAN SECTION;  Surgeon: Azucena Fallen, MD;  Location: [facility];  Service: Obstetrics;  Laterality: N/A;   WISDOM TOOTH EXTRACTION      OB History     Gravida  1   Para  1   Term  1   Preterm      AB      Living  1      SAB      IAB      Ectopic      Multiple  0   Live Births  1            Home Medications    Prior to Admission medications   Medication Sig Start Date End Date Taking? Authorizing Provider  benzonatate (TESSALON) 100 MG capsule Take 2 capsules (200 mg total) by mouth 3 (three) times daily as needed for cough. 01/05/23  Yes Michel Hendon, DO  fluconazole (DIFLUCAN) 150 MG tablet Take 1 tablet (150 mg total) by mouth daily. Take one tablet today.  May repeat in 3 days. 09/06/21  Yes  Sharion Balloon, NP  fluticasone (FLONASE) 50 MCG/ACT nasal spray Place 1 spray into both nostrils daily. 01/12/18  Yes Jacelyn Pi, Lilia Argue, MD  levothyroxine (SYNTHROID, LEVOTHROID) 112 MCG tablet Take 112 mcg by mouth daily before breakfast.   Yes [provider]  norgestimate-ethinyl estradiol (ORTHO-CYCLEN,SPRINTEC,PREVIFEM) 0.25-35 MG-MCG tablet Take 1 tablet by mouth daily.   Yes [provider]  Olopatadine HCl 0.2 % SOLN Apply 1 drop to eye daily. 01/12/18  Yes Jacelyn Pi, Lilia Argue, MD  ondansetron (ZOFRAN ODT) 4 MG disintegrating tablet Take 1 tablet (4 mg total) by mouth every 8 (eight) hours as needed for nausea or vomiting. 01/18/21  Yes Bast, Traci A, NP  albuterol (VENTOLIN HFA) 108 (90 Base) MCG/ACT inhaler Inhale 1-2 puffs into the lungs every 6 (six) hours as needed for wheezing or shortness of breath. 01/05/23   Lyndee Hensen, DO    Family History Family History  Problem Relation Age of Onset   Diabetes Mother    Hypertension Father    Liver cancer Maternal Grandfather    Colon cancer Paternal Grandfather     Social History Social History  Tobacco Use   Smoking status: Never   Smokeless tobacco: Never  Substance Use Topics   Alcohol use: Yes    Comment: occasional   Drug use: No     Allergies   Patient has no known allergies.   Review of Systems Review of Systems: negative unless otherwise stated in HPI.      Physical Exam Triage Vital Signs ED Triage Vitals  Enc Vitals Group     BP 01/05/23 0952 (!) 139/108     Pulse Rate 01/05/23 0952 (!) 104     Resp 01/05/23 0952 16     Temp 01/05/23 0952 99 F (37.2 C)     Temp Source 01/05/23 0952 Oral     SpO2 01/05/23 0952 96 %     Weight 01/05/23 0952 221 lb (100.2 kg)     Height 01/05/23 0952 '5\' 4"'$  (1.626 m)     Head Circumference --      Peak Flow --      Pain Score 01/05/23 0951 0     Pain Loc --      Pain Edu? --      Excl. in [place]? --    No data found.  Updated Vital  Signs BP (!) 139/108 (BP Location: Left Arm)   Pulse (!) 104   Temp 99 F (37.2 C) (Oral)   Resp 16   Ht '5\' 4"'$  (1.626 m)   Wt 100.2 kg   SpO2 96%   BMI 37.93 kg/m   Visual Acuity Right Eye Distance:   Left Eye Distance:   Bilateral Distance:    Right Eye Near:   Left Eye Near:    Bilateral Near:     Physical Exam GEN:   alert, non-toxic appearing female in no distress    HENT:  mucus membranes moist, oropharyngeal without lesions or exudate, 1 + tonsillar hypertrophy, mild oropharyngeal erythema, clear nasal discharge, bilateral TM normal EYES:   pupils equal and reactive, no scleral injection or discharge NECK:  normal ROM, no meningismus   RESP:  no increased work of breathing, clear to auscultation bilaterally CVS:   regular rhythm, tachycardic Skin:   warm and dry, no rash on visible skin    UC Treatments / Results  Labs (all labs ordered are listed, but only abnormal results are displayed) Labs Reviewed - No data to display  EKG   Radiology No results found.  Procedures Procedures (including critical care time)  Medications Ordered in UC Medications - No data to display  Initial Impression / Assessment and Plan / UC Course  I have reviewed the triage vital signs and the nursing notes.  Pertinent labs & imaging results that were available during my care of the patient were reviewed by me and considered in my medical decision making (see chart for details).       Pt is a 37 y.o. female who presents for 5 days of flu-like symptoms. Caroline Howe has slightly elevated temp here 99 F and is tachycardic. Satting well on room air. Overall pt is ill but non-toxic appearing, well hydrated, without respiratory distress. Pulmonary exam is unremarkable.  COVID and influenza testing deferred due to duration of symptoms. Strep test and chest xray offered but declined.  History consistent with viral respiratory illness. Discussed symptomatic treatment.  Explained lack of  efficacy of antibiotics in viral disease.  Typical duration of symptoms discussed. Tessalon perles have worked well for her in the past. Refilled her albuterol inhaler.   Return and  ED precautions given and voiced understanding. Discussed MDM, treatment plan and plan for follow-up with patient  who agrees with plan.     Final Clinical Impressions(s) / UC Diagnoses   Final diagnoses:  Influenza-like illness     Discharge Instructions      We will contact you if your COVID/influenza test is positive.  Please quarantine while you wait for the results.  If your test is negative you may resume normal activities.  If your test is positive please continue to quarantine for at least 5 days from your symptom onset or until you are without a fever for at least 24 hours after the medications.    If your were prescribed medication, stop by the pharmacy to pick them up.   You can take Tylenol and/or Ibuprofen as needed for fever reduction and pain relief.    For cough: honey 1/2 to 1 teaspoon (you can dilute the honey in water or another fluid).  You can also use guaifenesin and dextromethorphan for cough. You can use a humidifier for chest congestion and cough.  If you don't have a humidifier, you can sit in the bathroom with the hot shower running.      For sore throat: try warm salt water gargles, Mucinex sore throat cough drops or cepacol lozenges, throat spray, warm tea or water with lemon/honey, popsicles or ice, or OTC cold relief medicine for throat discomfort. You can also purchase chloraseptic spray at the pharmacy or dollar store.   For congestion: take a daily anti-histamine like Zyrtec, Claritin, and a oral decongestant, such as pseudoephedrine.  You can also use Flonase 1-2 sprays in each nostril daily. Afrin is also a good option, if you do not have high blood pressure.    It is important to stay hydrated: drink plenty of fluids (water, gatorade/powerade/pedialyte, juices, or teas) to  keep your throat moisturized and help further relieve irritation/discomfort.    Return or go to the Emergency Department if symptoms worsen or do not improve in the next few days      ED Prescriptions     Medication Sig Dispense Auth. Provider   albuterol (VENTOLIN HFA) 108 (90 Base) MCG/ACT inhaler Inhale 1-2 puffs into the lungs every 6 (six) hours as needed for wheezing or shortness of breath. 1 each Ziaire Bieser, DO   benzonatate (TESSALON) 100 MG capsule Take 2 capsules (200 mg total) by mouth 3 (three) times daily as needed for cough. 24 capsule Lyndee Hensen, DO      PDMP not reviewed this encounter.   Lyndee Hensen, DO 01/05/23 1057

## 2023-01-08 ENCOUNTER — Ambulatory Visit
Admission: EM | Admit: 2023-01-08 | Discharge: 2023-01-08 | Disposition: A | Discharge status: Home / Self Care | Payer: BC Managed Care – PPO

## 2023-01-08 ENCOUNTER — Encounter: Payer: Self-pay | Admitting: Emergency Medicine

## 2023-01-08 DIAGNOSIS — H6691 Otitis media, unspecified, right ear: Secondary | ICD-10-CM | POA: Diagnosis not present

## 2023-01-08 DIAGNOSIS — Z8619 Personal history of other infectious and parasitic diseases: Secondary | ICD-10-CM | POA: Diagnosis not present

## 2023-01-08 DIAGNOSIS — J0100 Acute maxillary sinusitis, unspecified: Secondary | ICD-10-CM

## 2023-01-08 DIAGNOSIS — H1033 Unspecified acute conjunctivitis, bilateral: Secondary | ICD-10-CM

## 2023-01-08 MED ORDER — AMOXICILLIN 875 MG PO TABS: 875.0000 mg | ORAL_TABLET | Freq: Two times a day (BID) | ORAL | 0 refills | Status: AC

## 2023-01-08 MED ORDER — FLUCONAZOLE 150 MG PO TABS: 150.0000 mg | ORAL_TABLET | Freq: Every day | ORAL | 0 refills | Status: AC

## 2023-01-08 MED ORDER — POLYMYXIN B-TRIMETHOPRIM 10000-0.1 UNIT/ML-% OP SOLN
1.0000 [drp] | Freq: Four times a day (QID) | OPHTHALMIC | 0 refills | Status: AC
Start: 2023-01-08 — End: 2023-01-15

## 2023-01-08 NOTE — ED Triage Notes (Addendum)
Patient in office today due to cough, ear pain(right) feels like it stopped up x 1 week ago. Stated that her eyes were closed shut this morning. Believes due to ear pain has given her a sorethroat.  Vomiting on Thursday last week but  has subsided  Otc: Ibuprofen  Denies: fever, Lamonte Sakai

## 2023-01-08 NOTE — Discharge Instructions (Addendum)
Take the amoxicillin and Diflucan as directed.    Use the eye drops as directed.    Follow up with your primary care provider if your symptoms are not improving.

## 2023-01-08 NOTE — ED Provider Notes (Signed)
Roderic Palau    CSN: [id number] Arrival date & time: 01/08/23  [id number]      History   Chief Complaint Chief Complaint  Patient presents with   Sore Throat   Cough   Conjunctivitis    HPI Caroline Howe is a 37 y.o. female.  Patient presents with 8 day history of right ear pain, sore throat, cough.  She also presents with bilateral eye redness, drainage, crusting today.  She had vomiting last week but none since.  She denies fever, shortness of breath, diarrhea, or other symptoms.  No OTC medications taken today.  Patient was seen at [facility] urgent care on 01/05/2023; diagnosed with influenza-like illness; treated with Tessalon Perles.  The history is provided by the patient and medical records.    Past Medical History:  Diagnosis Date   Anxiety    GERD (gastroesophageal reflux disease)    Hypothyroidism    Postpartum care following cesarean delivery (7/4) 05/01/2016    Patient Active Problem List   Diagnosis Date Noted   Cesarean delivery delivered 05/01/2016   Postpartum care following cesarean delivery (7/4) 05/01/2016   Hypothyroid in pregnancy, antepartum 04/29/2016   Hypothyroidism 10/04/2013   GERD (gastroesophageal reflux disease) 10/04/2013    Past Surgical History:  Procedure Laterality Date   CESAREAN SECTION N/A 04/30/2016   Procedure: CESAREAN SECTION;  Surgeon: Azucena Fallen, MD;  Location: [facility];  Service: Obstetrics;  Laterality: N/A;   WISDOM TOOTH EXTRACTION      OB History     Gravida  1   Para  1   Term  1   Preterm      AB      Living  1      SAB      IAB      Ectopic      Multiple  0   Live Births  1            Home Medications    Prior to Admission medications   Medication Sig Start Date End Date Taking? Authorizing Provider  albuterol (VENTOLIN HFA) 108 (90 Base) MCG/ACT inhaler Inhale 1-2 puffs into the lungs every 6 (six) hours as needed for wheezing or shortness of breath. 01/05/23  Yes Brimage,  Vondra, DO  amoxicillin (AMOXIL) 875 MG tablet Take 1 tablet (875 mg total) by mouth 2 (two) times daily for 10 days. 01/08/23 01/18/23 Yes Sharion Balloon, NP  benzonatate (TESSALON) 100 MG capsule Take 2 capsules (200 mg total) by mouth 3 (three) times daily as needed for cough. 01/05/23  Yes Brimage, Vondra, DO  fluconazole (DIFLUCAN) 150 MG tablet Take 1 tablet (150 mg total) by mouth daily. Take one tablet today.  May repeat in 3 days. 01/08/23  Yes Sharion Balloon, NP  levothyroxine (SYNTHROID, LEVOTHROID) 112 MCG tablet Take 112 mcg by mouth daily before breakfast.   Yes [provider]  norgestimate-ethinyl estradiol (ORTHO-CYCLEN,SPRINTEC,PREVIFEM) 0.25-35 MG-MCG tablet Take 1 tablet by mouth daily.   Yes [provider]  trimethoprim-polymyxin b (POLYTRIM) ophthalmic solution Place 1 drop into both eyes 4 (four) times daily for 7 days. 01/08/23 01/15/23 Yes Sharion Balloon, NP  buPROPion (WELLBUTRIN XL) 150 MG 24 hr tablet Take 150 mg by mouth every morning.    [provider]  fluticasone (FLONASE) 50 MCG/ACT nasal spray Place 1 spray into both nostrils daily. 01/12/18   Daleen Squibb, MD  Olopatadine HCl 0.2 % SOLN Apply 1 drop to eye daily.  01/12/18   Daleen Squibb, MD  ondansetron (ZOFRAN ODT) 4 MG disintegrating tablet Take 1 tablet (4 mg total) by mouth every 8 (eight) hours as needed for nausea or vomiting. 01/18/21   Orvan July, NP    Family History Family History  Problem Relation Age of Onset   Diabetes Mother    Hypertension Father    Liver cancer Maternal Grandfather    Colon cancer Paternal Grandfather     Social History Social History   Tobacco Use   Smoking status: Never   Smokeless tobacco: Never  Substance Use Topics   Alcohol use: Yes    Comment: occasional   Drug use: No     Allergies   Patient has no known allergies.   Review of Systems Review of Systems  Constitutional:  Negative for chills and fever.  HENT:   Positive for congestion, ear pain, postnasal drip, rhinorrhea and sore throat.   Eyes:  Positive for discharge, redness and itching. Negative for pain and visual disturbance.  Respiratory:  Positive for cough. Negative for shortness of breath.   Cardiovascular:  Negative for chest pain and palpitations.  Gastrointestinal:  Negative for diarrhea and vomiting.  Skin:  Negative for color change and rash.  All other systems reviewed and are negative.    Physical Exam Triage Vital Signs ED Triage Vitals  Enc Vitals Group     BP 01/08/23 0857 136/86     Pulse Rate 01/08/23 0857 96     Resp 01/08/23 0857 16     Temp 01/08/23 0857 98.5 F (36.9 C)     Temp Source 01/08/23 0857 Oral     SpO2 01/08/23 0857 97 %     Weight 01/08/23 0853 221 lb (100.2 kg)     Height 01/08/23 0853 '5\' 4"'$  (1.626 m)     Head Circumference --      Peak Flow --      Pain Score --      Pain Loc --      Pain Edu? --      Excl. in [place]? --    No data found.  Updated Vital Signs BP 136/86 (BP Location: Right Arm)   Pulse 96   Temp 98.5 F (36.9 C) (Oral)   Resp 16   Ht '5\' 4"'$  (1.626 m)   Wt 221 lb (100.2 kg)   LMP 12/24/2022   SpO2 97%   BMI 37.93 kg/m   Visual Acuity Right Eye Distance:   Left Eye Distance:   Bilateral Distance:    Right Eye Near:   Left Eye Near:    Bilateral Near:     Physical Exam Vitals and nursing note reviewed.  Constitutional:      General: She is not in acute distress.    Appearance: Normal appearance. She is well-developed. She is ill-appearing.  HENT:     Right Ear: Tympanic membrane is erythematous.     Left Ear: Tympanic membrane normal.     Nose: Congestion and rhinorrhea present.     Mouth/Throat:     Mouth: Mucous membranes are moist.     Pharynx: Oropharynx is clear.  Eyes:     General: Lids are normal. Vision grossly intact.     Extraocular Movements: Extraocular movements intact.     Conjunctiva/sclera:     Right eye: Right conjunctiva is injected.      Left eye: Left conjunctiva is injected.     Pupils: Pupils are equal, round, and  reactive to light.  Cardiovascular:     Rate and Rhythm: Normal rate and regular rhythm.     Heart sounds: Normal heart sounds.  Pulmonary:     Effort: Pulmonary effort is normal. No respiratory distress.     Breath sounds: Normal breath sounds.  Musculoskeletal:     Cervical back: Neck supple.  Skin:    General: Skin is warm and dry.  Neurological:     Mental Status: She is alert.  Psychiatric:        Mood and Affect: Mood normal.        Behavior: Behavior normal.      UC Treatments / Results  Labs (all labs ordered are listed, but only abnormal results are displayed) Labs Reviewed - No data to display  EKG   Radiology No results found.  Procedures Procedures (including critical care time)  Medications Ordered in UC Medications - No data to display  Initial Impression / Assessment and Plan / UC Course  I have reviewed the triage vital signs and the nursing notes.  Pertinent labs & imaging results that were available during my care of the patient were reviewed by me and considered in my medical decision making (see chart for details).   Right otitis media, acute sinusitis, bilateral bacterial conjunctivitis, history of vaginal yeast infection.  Treating with ear and sinus infection with amoxicillin.  Patient reports history of vaginal yeast infection with antibiotic use; Diflucan prescribed per patient request.  Treating eye infection with Polytrim eye drops.  Instructed patient to follow up with her PCP if her symptoms are not improving.  Education provided on otitis media, sinus infection, bacterial conjunctivitis.  She agrees to plan of care.    Final Clinical Impressions(s) / UC Diagnoses   Final diagnoses:  Right otitis media, unspecified otitis media type  Acute non-recurrent maxillary sinusitis  Acute bacterial conjunctivitis of both eyes  History of candidiasis of vagina      Discharge Instructions      Take the amoxicillin and Diflucan as directed.    Use the eye drops as directed.    Follow up with your primary care provider if your symptoms are not improving.        ED Prescriptions     Medication Sig Dispense Auth. Provider   amoxicillin (AMOXIL) 875 MG tablet Take 1 tablet (875 mg total) by mouth 2 (two) times daily for 10 days. 20 tablet Sharion Balloon, NP   fluconazole (DIFLUCAN) 150 MG tablet Take 1 tablet (150 mg total) by mouth daily. Take one tablet today.  May repeat in 3 days. 2 tablet Sharion Balloon, NP   trimethoprim-polymyxin b (POLYTRIM) ophthalmic solution Place 1 drop into both eyes 4 (four) times daily for 7 days. 10 mL Sharion Balloon, NP      PDMP not reviewed this encounter.   Sharion Balloon, NP 01/08/23 [phone]

## 2024-01-04 ENCOUNTER — Other Ambulatory Visit: Payer: Self-pay

## 2024-01-04 ENCOUNTER — Ambulatory Visit
Admission: RE | Admit: 2024-01-04 | Discharge: 2024-01-04 | Disposition: A | Discharge status: Home / Self Care | Source: Ambulatory Visit | Attending: Emergency Medicine

## 2024-01-04 VITALS — BP 146/106 | HR 114 | Temp 99.5°F | Resp 18

## 2024-01-04 DIAGNOSIS — J069 Acute upper respiratory infection, unspecified: Secondary | ICD-10-CM | POA: Diagnosis not present

## 2024-01-04 LAB — POC COVID19/FLU A&B COMBO
Covid Antigen, POC: NEGATIVE
Influenza A Antigen, POC: NEGATIVE
Influenza B Antigen, POC: NEGATIVE

## 2024-01-04 LAB — POCT RAPID STREP A (OFFICE): Rapid Strep A Screen: NEGATIVE

## 2024-01-04 MED ORDER — BENZONATATE 100 MG PO CAPS: 100.0000 mg | ORAL_CAPSULE | Freq: Three times a day (TID) | ORAL | 0 refills | Status: AC

## 2024-01-04 NOTE — ED Triage Notes (Signed)
 C/O sore throat, cough, congestion. Symptoms x 4 days. Patient is taking ibuprofen for symptoms.

## 2024-01-04 NOTE — ED Provider Notes (Signed)
 [facility]    CSN: [id number] Arrival date & time: 01/04/24  1007      History   Chief Complaint Chief Complaint  Patient presents with   Sore Throat    Entered by patient    HPI Caroline Howe is a 38 y.o. female.   Patient presents to clinic over concerns of sore throat, dry cough, nasal congestion and rhinorrhea.  Her symptoms started 4 days ago.  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  Has been taken ibuprofen for symptoms, has not had any medication today.    Denies any known fevers at home.  Denies wheezing or shortness of breath.  The history is provided by the patient and medical records.  Sore Throat    Past Medical History:  Diagnosis Date   Anxiety    GERD (gastroesophageal reflux disease)    Hypothyroidism    Postpartum care following cesarean delivery (7/4) 05/01/2016    Patient Active Problem List   Diagnosis Date Noted   Cesarean delivery delivered 05/01/2016   Postpartum care following cesarean delivery (7/4) 05/01/2016   Hypothyroid in pregnancy, antepartum 04/29/2016   Hypothyroidism 10/04/2013   GERD (gastroesophageal reflux disease) 10/04/2013    Past Surgical History:  Procedure Laterality Date   CESAREAN SECTION N/A 04/30/2016   Procedure: CESAREAN SECTION;  Surgeon: Shea Evans, MD;  Location: [facility] B[facility] SU[facility];  Service: Obstetrics;  Laterality: N/A;   WISDOM TOOTH EXTRACTION      OB History     Gravida  1   Para  1   Term  1   Preterm      AB      Living  1      SAB      IAB      Ectopic      Multiple  0   Live Births  1            Home Medications    Prior to Admission medications   Medication Sig Start Date End Date Taking? Authorizing Provider  benzonatate (TESSALON) 100 MG capsule Take 1 capsule (100 mg total) by mouth every 8 (eight) hours. 01/04/24  Yes Rinaldo Ratel, [place] N, FNP  albuterol (VENTOLIN HFA) 108 (90 Base)  MCG/ACT inhaler Inhale 1-2 puffs into the lungs every 6 (six) hours as needed for wheezing or shortness of breath. 01/05/23   Brimage, Seward Meth, DO  buPROPion (WELLBUTRIN XL) 150 MG 24 hr tablet Take 150 mg by mouth every morning.    [provider]  fluconazole (DIFLUCAN) 150 MG tablet Take 1 tablet (150 mg total) by mouth daily. Take one tablet today.  May repeat in 3 days. 01/08/23   Mickie Bail, NP  fluticasone (FLONASE) 50 MCG/ACT nasal spray Place 1 spray into both nostrils daily. 01/12/18   Lezlie Lye, Meda Coffee, MD  levothyroxine (SYNTHROID, LEVOTHROID) 112 MCG tablet Take 112 mcg by mouth daily before breakfast.    [provider]  norgestimate-ethinyl estradiol (ORTHO-CYCLEN,SPRINTEC,PREVIFEM) 0.25-35 MG-MCG tablet Take 1 tablet by mouth daily.    [provider]  Olopatadine HCl 0.2 % SOLN Apply 1 drop to eye daily. 01/12/18   Lezlie Lye, Meda Coffee, MD  ondansetron (ZOFRAN ODT) 4 MG disintegrating tablet Take 1 tablet (4 mg total) by mouth every 8 (eight) hours as needed for nausea or vomiting. 01/18/21   Janace Aris, FNP    Family History Family History  Problem Relation Age of Onset   Diabetes Mother    Hypertension Father    Liver cancer Maternal Grandfather    Colon cancer Paternal Grandfather     Social History Social History   Tobacco Use   Smoking status: Never   Smokeless tobacco: Never  Substance Use Topics   Alcohol use: Yes    Comment: occasional   Drug use: No     Allergies   Patient has no known allergies.   Review of Systems Review of Systems  Per HPI   Physical Exam Triage Vital Signs ED Triage Vitals  Encounter Vitals Group     BP 01/04/24 1032 (!) 146/106     Systolic BP Percentile --      Diastolic BP Percentile --      Pulse Rate 01/04/24 1025 (!) 114     Resp 01/04/24 1025 18     Temp 01/04/24 1025 99.5 F (37.5 C)     Temp Source 01/04/24 1025 Oral     SpO2 01/04/24 1025 98 %     Weight --      Height --       Head Circumference --      Peak Flow --      Pain Score 01/04/24 1027 6     Pain Loc --      Pain Education --      Exclude from Growth Chart --    No data found.  Updated Vital Signs BP (!) 146/106 (BP Location: Left Arm)   Pulse (!) 114   Temp 99.5 F (37.5 C) (Oral)   Resp 18   LMP 01/04/2024   SpO2 98%   Visual Acuity Right Eye Distance:   Left Eye Distance:   Bilateral Distance:    Right Eye Near:   Left Eye Near:    Bilateral Near:     Physical Exam Vitals and nursing note reviewed.  Constitutional:      Appearance: Normal appearance. She is well-developed.  HENT:     Head: Normocephalic and atraumatic.     Right Ear: Tympanic membrane, ear canal and external ear normal.     Left Ear: Tympanic membrane, ear canal and external ear normal.     Nose: Congestion and rhinorrhea present.     Mouth/Throat:     Mouth: Mucous membranes are moist.     Pharynx: Posterior oropharyngeal erythema present.     Tonsils: No tonsillar exudate or tonsillar abscesses. 0 on the right. 0 on the left.  Eyes:     Conjunctiva/sclera: Conjunctivae normal.  Cardiovascular:     Rate and Rhythm: Normal rate and regular rhythm.     Heart sounds: Normal heart sounds. No murmur heard. Pulmonary:     Effort: Pulmonary effort is normal. No respiratory distress.     Breath sounds: Normal breath sounds. No wheezing.  Musculoskeletal:        General: Normal range of motion.  Skin:    General: Skin is warm and dry.  Neurological:     General: No focal deficit present.     Mental Status: She is alert.  Psychiatric:        Mood and Affect: Mood normal.      UC Treatments / Results  Labs (all labs ordered are listed, but only abnormal results are displayed) Labs Reviewed  POCT RAPID STREP A (OFFICE)  POC COVID19/FLU A&B COMBO    EKG   Radiology No results found.  Procedures Procedures (including critical  care time)  Medications Ordered in UC Medications - No data to  display  Initial Impression / Assessment and Plan / UC Course  I have reviewed the triage vital signs and the nursing notes.  Pertinent labs & imaging results that were available during my care of the patient were reviewed by me and considered in my medical decision making (see chart for details).  Vitals and triage reviewed, patient is hemodynamically stable.  Lungs are vesicular, heart with regular rate and rhythm.  Congestion, rhinorrhea and postnasal drip present on physical exam.  Uvula midline and tonsils without exudate.  POC rapid strep negative.  POC COVID and flu testing also negative.  Suspect other viral URI is causing symptoms.  Tessalon for cough.  Symptomatic management for viral URI discussed.  Plan of care, follow-up care return precautions given, no questions at this time.     Final Clinical Impressions(s) / UC Diagnoses   Final diagnoses:  Viral URI with cough     Discharge Instructions      Your COVID, flu and strep testing were negative.  You most likely have a viral illness.  These typically last around 5 to 7 days.  Warm saline gargles, tea with honey and over-the-counter cough drops can help with sore throat.  He can take the [facility] every 8 hours.  You can alternate between 100 mg of ibuprofen and 500 mg of Tylenol every 4-6 hours for fever, body aches or pain.  Follow-up with your primary care if your symptoms last over a week, return to clinic if you develop any new or concerning symptoms.      ED Prescriptions     Medication Sig Dispense Auth. Provider   benzonatate (TESSALON) 100 MG capsule Take 1 capsule (100 mg total) by mouth every 8 (eight) hours. 30 capsule Jaimya Feliciano, [place] N, [place]      PDMP not reviewed this encounter.   Christella App, [place] N, [place] 01/04/24 [phone]

## 2024-01-04 NOTE — Discharge Instructions (Addendum)
 Your COVID, flu and strep testing were negative.  You most likely have a viral illness.  These typically last around 5 to 7 days.  Warm saline gargles, tea with honey and over-the-counter cough drops can help with sore throat.  He can take the [facility] every 8 hours.  You can alternate between 100 mg of ibuprofen and 500 mg of Tylenol every 4-6 hours for fever, body aches or pain.  Follow-up with your primary care if your symptoms last over a week, return to clinic if you develop any new or concerning symptoms.
